# Patient Record
Sex: Female | Born: 1980 | Race: White | Hispanic: No | Marital: Married | Smoking: Never smoker
Health system: Southern US, Community
[De-identification: ages and names within clinical notes are randomized; demographics above are authoritative.]

## PROBLEM LIST (undated history)

## (undated) ENCOUNTER — Inpatient Hospital Stay (HOSPITAL_COMMUNITY): Payer: Self-pay

## (undated) DIAGNOSIS — O26899 Other specified pregnancy related conditions, unspecified trimester: Secondary | ICD-10-CM

## (undated) DIAGNOSIS — O09529 Supervision of elderly multigravida, unspecified trimester: Secondary | ICD-10-CM

## (undated) DIAGNOSIS — Z789 Other specified health status: Secondary | ICD-10-CM

## (undated) DIAGNOSIS — O4690 Antepartum hemorrhage, unspecified, unspecified trimester: Secondary | ICD-10-CM

## (undated) DIAGNOSIS — Z889 Allergy status to unspecified drugs, medicaments and biological substances status: Secondary | ICD-10-CM

## (undated) DIAGNOSIS — G43909 Migraine, unspecified, not intractable, without status migrainosus: Secondary | ICD-10-CM

## (undated) DIAGNOSIS — F419 Anxiety disorder, unspecified: Secondary | ICD-10-CM

## (undated) DIAGNOSIS — Z9049 Acquired absence of other specified parts of digestive tract: Secondary | ICD-10-CM

## (undated) DIAGNOSIS — R109 Unspecified abdominal pain: Secondary | ICD-10-CM

## (undated) HISTORY — DX: Migraine, unspecified, not intractable, without status migrainosus: G43.909

## (undated) HISTORY — PX: APPENDECTOMY: SHX54

---

## 2005-08-30 ENCOUNTER — Other Ambulatory Visit: Admission: RE | Admit: 2005-08-30 | Discharge: 2005-08-30 | Payer: Self-pay | Admitting: Obstetrics and Gynecology

## 2006-09-05 ENCOUNTER — Other Ambulatory Visit: Admission: RE | Admit: 2006-09-05 | Discharge: 2006-09-05 | Payer: Self-pay | Admitting: Obstetrics and Gynecology

## 2007-09-07 ENCOUNTER — Other Ambulatory Visit: Admission: RE | Admit: 2007-09-07 | Discharge: 2007-09-07 | Payer: Self-pay | Admitting: Obstetrics and Gynecology

## 2014-09-23 NOTE — L&D Delivery Note (Signed)
Cesarean Section Procedure Note  Indications: 35 week 6 day SIUP breech presentation s/p PPROM  Pre-operative Diagnosis: 35 week 6 day SIUP breech presentation s/p PPROM  Post-operative Diagnosis: same  Procedure:  Primary cesarean delivery                         Surgeon: Wynonia HazardPINN, Delorus Langwell STACIA, MD  Assistants: None  Anesthesia: Spinal anesthesia  ASA Class: 2  Procedure Details   The patient was counseled about the risks, benefits, complications of the cesarean section. The patient concurred with the proposed plan, giving informed consent.  The site of surgery properly noted/marked. The patient was taken to Operating Room # 9, identified as [place], [place]. Advised patient of provider's approval for requested procedure, as well as any comments/instructions from provider and the procedure verified as C-Section Delivery. A Time Out was held and the above information confirmed.  After spinal was found to adequate , the patient was placed in the dorsal supine position with a leftward tilt, draped and prepped in the usual sterile manner. A Pfannenstiel incision was made and carried down through the subcutaneous tissue to the fascia.  The fascia was incised in the midline and the fascial incision was extended laterally with Mayo scissors. The superior aspect of the fascial incision was grasped with Coker clamps x2, tented up and the rectus muscles dissected off sharply with the bovie.  The rectus was then dissected off with blunt dissection and Mayo scissors inferiorly. The rectus muscles were separated in the midline. The abdominal peritoneum was identified, tented up, entered sharply using Metzenbaum scissors, and the incision was extended superiorly and inferiorly with good visualization of the bladder. The Alexis retractor was then deployed. The vesicouterine peritoneum was identified, tented up, entered sharply with Metzenbaum scissors, and the bladder flap was created digitally. Scalpel was then  used to make a low transverse incision on the uterus which was extended laterally with  blunt dissection. The fetal breech was identified, the legs were delivered  The body was delivered until the shoulders.  Each arm was delivered via Pinard maneuver. The head was delivered via Domenic SchwabMariceau, [place], [place] maneuver. The A live female infant was bulb suctioned on the operative field cried vigorously, cord was clamped and cut and the infant was passed to the waiting neonatologist. Apgars 8/9. Placenta was then delivered spontaneously, intact and appear normal, the uterus was cleared of all clot and debris. The uterine incision was repaired with #0 Monocryl in running locked fashion. A second imbricating suture was performed using the same suture. The incision was hemostatic. Ovaries and tubes were inspected and normal. The Alexis retractor was removed. The abdominal cavity was cleared of all clot and debris. The abdominal peritoneum was reapproximated with 2-0 chromic  in a running fashion, the rectus muscles was reapproximated with #2 chromic in interrupted fashion. The fascia was closed with 0 Vicryl in a running fashion. The subcuticular layer was irrigated and all bleeders cauterized.  20 mL of Exparel diluted in 30 mL of 0.25% Marcaine was injected into the subcutaneous layer.  The Scarpas fascia was re-approximated with interrupted sutures of 2-0 plain.  The skin was closed with 4-0 vicryl in a subcuticular fashion using a Mellody DanceKeith needle. The incision was dressed with benzoine, steri strips and pressure dressing. All sponge lap and needle counts were correct x3.   Patient tolerated the procedure well and recovered in stable condition following the procedure.  Instrument, sponge, and needle counts  were correct prior the abdominal closure and at the conclusion of the case.   Findings: Live female infant, Apgars 8/9, clear amniotic fluid, normal appearing placenta, normal uterus, bilateral tubes and  ovaries  Estimated Blood Loss: 600mL  IVF:  1800 mL LR         Drains: Foley catheter  Urine output: 700 mL clear         Specimens: Placenta to Pathology         Implants: none         Complications:  None; patient tolerated the procedure well.         Disposition: PACU - hemodynamically stable.   Finn Altemose STACIA 

## 2014-10-06 LAB — OB RESULTS CONSOLE ABO/RH: RH Type: POSITIVE

## 2014-10-06 LAB — OB RESULTS CONSOLE ANTIBODY SCREEN: ANTIBODY SCREEN: NEGATIVE

## 2014-10-06 LAB — OB RESULTS CONSOLE RPR: RPR: NONREACTIVE

## 2014-10-06 LAB — OB RESULTS CONSOLE GC/CHLAMYDIA
Chlamydia: NEGATIVE
GC PROBE AMP, GENITAL: NEGATIVE

## 2014-10-06 LAB — OB RESULTS CONSOLE HIV ANTIBODY (ROUTINE TESTING): HIV: NONREACTIVE

## 2014-10-06 LAB — OB RESULTS CONSOLE RUBELLA ANTIBODY, IGM: Rubella: IMMUNE

## 2014-10-06 LAB — OB RESULTS CONSOLE HEPATITIS B SURFACE ANTIGEN: Hepatitis B Surface Ag: NEGATIVE

## 2015-04-09 ENCOUNTER — Inpatient Hospital Stay (HOSPITAL_COMMUNITY): Payer: 59

## 2015-04-09 ENCOUNTER — Inpatient Hospital Stay (HOSPITAL_COMMUNITY)
Admission: AD | Admit: 2015-04-09 | Discharge: 2015-04-12 | DRG: 766 | Disposition: A | Discharge status: Home / Self Care | Payer: 59 | Source: Ambulatory Visit | Attending: Obstetrics & Gynecology | Admitting: Obstetrics & Gynecology

## 2015-04-09 ENCOUNTER — Encounter (HOSPITAL_COMMUNITY): Payer: Self-pay | Admitting: Medical

## 2015-04-09 DIAGNOSIS — O42919 Preterm premature rupture of membranes, unspecified as to length of time between rupture and onset of labor, unspecified trimester: Secondary | ICD-10-CM | POA: Insufficient documentation

## 2015-04-09 DIAGNOSIS — Z3A35 35 weeks gestation of pregnancy: Secondary | ICD-10-CM | POA: Insufficient documentation

## 2015-04-09 DIAGNOSIS — O42913 Preterm premature rupture of membranes, unspecified as to length of time between rupture and onset of labor, third trimester: Secondary | ICD-10-CM | POA: Diagnosis present

## 2015-04-09 DIAGNOSIS — O321XX0 Maternal care for breech presentation, not applicable or unspecified: Principal | ICD-10-CM | POA: Insufficient documentation

## 2015-04-09 DIAGNOSIS — Z98891 History of uterine scar from previous surgery: Secondary | ICD-10-CM

## 2015-04-09 HISTORY — DX: Acquired absence of other specified parts of digestive tract: Z90.49

## 2015-04-09 HISTORY — DX: Allergy status to unspecified drugs, medicaments and biological substances: Z88.9

## 2015-04-09 HISTORY — DX: Other specified health status: Z78.9

## 2015-04-09 HISTORY — DX: Anxiety disorder, unspecified: F41.9

## 2015-04-09 LAB — POCT FERN TEST
POCT FERN TEST: POSITIVE
POCT Fern Test: POSITIVE

## 2015-04-10 ENCOUNTER — Encounter (HOSPITAL_COMMUNITY)
Admission: AD | Disposition: A | Discharge status: Home / Self Care | Payer: Self-pay | Source: Ambulatory Visit | Attending: Obstetrics & Gynecology

## 2015-04-10 ENCOUNTER — Encounter (HOSPITAL_COMMUNITY): Payer: Self-pay | Admitting: *Deleted

## 2015-04-10 ENCOUNTER — Inpatient Hospital Stay (HOSPITAL_COMMUNITY): Payer: 59 | Admitting: Anesthesiology

## 2015-04-10 DIAGNOSIS — Z98891 History of uterine scar from previous surgery: Secondary | ICD-10-CM

## 2015-04-10 DIAGNOSIS — O42919 Preterm premature rupture of membranes, unspecified as to length of time between rupture and onset of labor, unspecified trimester: Secondary | ICD-10-CM | POA: Insufficient documentation

## 2015-04-10 DIAGNOSIS — O42913 Preterm premature rupture of membranes, unspecified as to length of time between rupture and onset of labor, third trimester: Secondary | ICD-10-CM | POA: Diagnosis present

## 2015-04-10 DIAGNOSIS — Z3A35 35 weeks gestation of pregnancy: Secondary | ICD-10-CM | POA: Insufficient documentation

## 2015-04-10 DIAGNOSIS — O321XX0 Maternal care for breech presentation, not applicable or unspecified: Secondary | ICD-10-CM | POA: Insufficient documentation

## 2015-04-10 LAB — CBC
HCT: 33.1 % — ABNORMAL LOW (ref 36.0–46.0)
Hemoglobin: 11.1 g/dL — ABNORMAL LOW (ref 12.0–15.0)
MCH: 28.3 pg (ref 26.0–34.0)
MCHC: 33.5 g/dL (ref 30.0–36.0)
MCV: 84.4 fL (ref 78.0–100.0)
PLATELETS: 289 10*3/uL (ref 150–400)
RBC: 3.92 MIL/uL (ref 3.87–5.11)
RDW: 13.9 % (ref 11.5–15.5)
WBC: 13.4 10*3/uL — ABNORMAL HIGH (ref 4.0–10.5)

## 2015-04-10 LAB — TYPE AND SCREEN
ABO/RH(D): A POS
Antibody Screen: NEGATIVE

## 2015-04-10 LAB — ABO/RH: ABO/RH(D): A POS

## 2015-04-10 SURGERY — Surgical Case
Anesthesia: Spinal | Site: Abdomen

## 2015-04-10 MED ORDER — SIMETHICONE 80 MG PO CHEW: 80.0000 mg | CHEWABLE_TABLET | ORAL | Status: DC | PRN

## 2015-04-10 MED ORDER — DIBUCAINE 1 % RE OINT: 1.0000 "application " | TOPICAL_OINTMENT | RECTAL | Status: DC | PRN

## 2015-04-10 MED ORDER — NALBUPHINE HCL 10 MG/ML IJ SOLN: 5.0000 mg | INTRAMUSCULAR | Status: DC | PRN

## 2015-04-10 MED ORDER — NALBUPHINE HCL 10 MG/ML IJ SOLN: 5.0000 mg | Freq: Once | INTRAMUSCULAR | Status: AC | PRN

## 2015-04-10 MED ORDER — MENTHOL 3 MG MT LOZG: 1.0000 | LOZENGE | OROMUCOSAL | Status: DC | PRN

## 2015-04-10 MED ORDER — SCOPOLAMINE 1 MG/3DAYS TD PT72
MEDICATED_PATCH | TRANSDERMAL | Status: DC | PRN
  Administered 2015-04-10: 1 via TRANSDERMAL

## 2015-04-10 MED ORDER — FENTANYL CITRATE (PF) 100 MCG/2ML IJ SOLN
INTRAMUSCULAR | Status: DC | PRN
  Administered 2015-04-10: 20 ug via INTRAVENOUS
  Administered 2015-04-10: 80 ug via INTRAVENOUS

## 2015-04-10 MED ORDER — DIPHENHYDRAMINE HCL 50 MG/ML IJ SOLN
12.5000 mg | INTRAMUSCULAR | Status: DC | PRN
Start: 2015-04-10 — End: 2015-04-12

## 2015-04-10 MED ORDER — CITRIC ACID-SODIUM CITRATE 334-500 MG/5ML PO SOLN
30.0000 mL | Freq: Once | ORAL | Status: AC
  Administered 2015-04-10: 30 mL via ORAL
  Filled 2015-04-10: qty 15

## 2015-04-10 MED ORDER — NALOXONE HCL 1 MG/ML IJ SOLN
1.0000 ug/kg/h | INTRAVENOUS | Status: DC | PRN
  Filled 2015-04-10: qty 2

## 2015-04-10 MED ORDER — TETANUS-DIPHTH-ACELL PERTUSSIS 5-2.5-18.5 LF-MCG/0.5 IM SUSP: 0.5000 mL | Freq: Once | INTRAMUSCULAR | Status: DC

## 2015-04-10 MED ORDER — PHENYLEPHRINE 8 MG IN D5W 100 ML (0.08MG/ML) PREMIX OPTIME
INJECTION | INTRAVENOUS | Status: DC | PRN
  Administered 2015-04-10: 50 ug/min via INTRAVENOUS

## 2015-04-10 MED ORDER — ONDANSETRON HCL 4 MG/2ML IJ SOLN
INTRAMUSCULAR | Status: AC
  Filled 2015-04-10: qty 2

## 2015-04-10 MED ORDER — ACETAMINOPHEN 500 MG PO TABS
1000.0000 mg | ORAL_TABLET | Freq: Four times a day (QID) | ORAL | Status: AC
  Administered 2015-04-10: 1000 mg via ORAL
  Filled 2015-04-10: qty 2

## 2015-04-10 MED ORDER — MORPHINE SULFATE 0.5 MG/ML IJ SOLN
INTRAMUSCULAR | Status: AC
  Filled 2015-04-10: qty 10

## 2015-04-10 MED ORDER — DIPHENHYDRAMINE HCL 25 MG PO CAPS: 25.0000 mg | ORAL_CAPSULE | ORAL | Status: DC | PRN

## 2015-04-10 MED ORDER — SODIUM CHLORIDE 0.9 % IJ SOLN: 3.0000 mL | INTRAMUSCULAR | Status: DC | PRN

## 2015-04-10 MED ORDER — DEXAMETHASONE SODIUM PHOSPHATE 10 MG/ML IJ SOLN
INTRAMUSCULAR | Status: AC
  Filled 2015-04-10: qty 1

## 2015-04-10 MED ORDER — FENTANYL CITRATE (PF) 100 MCG/2ML IJ SOLN: 25.0000 ug | INTRAMUSCULAR | Status: DC | PRN

## 2015-04-10 MED ORDER — IBUPROFEN 600 MG PO TABS
600.0000 mg | ORAL_TABLET | Freq: Four times a day (QID) | ORAL | Status: DC
  Administered 2015-04-10 – 2015-04-12 (×9): 600 mg via ORAL
  Filled 2015-04-10 (×9): qty 1

## 2015-04-10 MED ORDER — SCOPOLAMINE 1 MG/3DAYS TD PT72
MEDICATED_PATCH | TRANSDERMAL | Status: AC
  Filled 2015-04-10: qty 1

## 2015-04-10 MED ORDER — OXYTOCIN 10 UNIT/ML IJ SOLN
40.0000 [IU] | INTRAMUSCULAR | Status: DC | PRN
  Administered 2015-04-10: 40 [IU] via INTRAVENOUS

## 2015-04-10 MED ORDER — ONDANSETRON HCL 4 MG/2ML IJ SOLN
INTRAMUSCULAR | Status: DC | PRN
  Administered 2015-04-10: 4 mg via INTRAVENOUS

## 2015-04-10 MED ORDER — ONDANSETRON HCL 4 MG/2ML IJ SOLN: 4.0000 mg | Freq: Three times a day (TID) | INTRAMUSCULAR | Status: DC | PRN

## 2015-04-10 MED ORDER — OXYCODONE-ACETAMINOPHEN 5-325 MG PO TABS
1.0000 | ORAL_TABLET | ORAL | Status: DC | PRN
  Administered 2015-04-11 – 2015-04-12 (×6): 1 via ORAL
  Filled 2015-04-10 (×6): qty 1

## 2015-04-10 MED ORDER — NALOXONE HCL 0.4 MG/ML IJ SOLN: 0.4000 mg | INTRAMUSCULAR | Status: DC | PRN

## 2015-04-10 MED ORDER — BETAMETHASONE SOD PHOS & ACET 6 (3-3) MG/ML IJ SUSP
12.0000 mg | Freq: Once | INTRAMUSCULAR | Status: AC
  Administered 2015-04-10: 12 mg via INTRAMUSCULAR
  Filled 2015-04-10: qty 2

## 2015-04-10 MED ORDER — BUPIVACAINE IN DEXTROSE 0.75-8.25 % IT SOLN
INTRATHECAL | Status: DC | PRN
  Administered 2015-04-10: 12 mg via INTRATHECAL

## 2015-04-10 MED ORDER — PHENYLEPHRINE 8 MG IN D5W 100 ML (0.08MG/ML) PREMIX OPTIME
INJECTION | INTRAVENOUS | Status: AC
  Filled 2015-04-10: qty 100

## 2015-04-10 MED ORDER — PRENATAL MULTIVITAMIN CH
1.0000 | ORAL_TABLET | Freq: Every day | ORAL | Status: DC
  Administered 2015-04-10 – 2015-04-12 (×3): 1 via ORAL
  Filled 2015-04-10 (×3): qty 1

## 2015-04-10 MED ORDER — BUPIVACAINE LIPOSOME 1.3 % IJ SUSP
INTRAMUSCULAR | Status: DC | PRN
  Administered 2015-04-10: 20 mL

## 2015-04-10 MED ORDER — SIMETHICONE 80 MG PO CHEW
80.0000 mg | CHEWABLE_TABLET | Freq: Three times a day (TID) | ORAL | Status: DC
  Administered 2015-04-10 – 2015-04-12 (×5): 80 mg via ORAL
  Filled 2015-04-10 (×6): qty 1

## 2015-04-10 MED ORDER — OXYTOCIN 10 UNIT/ML IJ SOLN
INTRAMUSCULAR | Status: AC
  Filled 2015-04-10: qty 4

## 2015-04-10 MED ORDER — SENNOSIDES-DOCUSATE SODIUM 8.6-50 MG PO TABS
2.0000 | ORAL_TABLET | ORAL | Status: DC
  Administered 2015-04-12: 2 via ORAL
  Filled 2015-04-10 (×2): qty 2

## 2015-04-10 MED ORDER — DIPHENHYDRAMINE HCL 25 MG PO CAPS: 25.0000 mg | ORAL_CAPSULE | Freq: Four times a day (QID) | ORAL | Status: DC | PRN

## 2015-04-10 MED ORDER — ZOLPIDEM TARTRATE 5 MG PO TABS: 5.0000 mg | ORAL_TABLET | Freq: Every evening | ORAL | Status: DC | PRN

## 2015-04-10 MED ORDER — SCOPOLAMINE 1 MG/3DAYS TD PT72
1.0000 | MEDICATED_PATCH | Freq: Once | TRANSDERMAL | Status: DC
  Filled 2015-04-10: qty 1

## 2015-04-10 MED ORDER — FAMOTIDINE IN NACL 20-0.9 MG/50ML-% IV SOLN
20.0000 mg | Freq: Once | INTRAVENOUS | Status: AC
  Administered 2015-04-10: 20 mg via INTRAVENOUS
  Filled 2015-04-10: qty 50

## 2015-04-10 MED ORDER — OXYTOCIN 40 UNITS IN LACTATED RINGERS INFUSION - SIMPLE MED: 62.5000 mL/h | INTRAVENOUS | Status: AC

## 2015-04-10 MED ORDER — MEPERIDINE HCL 25 MG/ML IJ SOLN: 6.2500 mg | INTRAMUSCULAR | Status: DC | PRN

## 2015-04-10 MED ORDER — LACTATED RINGERS IV SOLN
INTRAVENOUS | Status: DC
  Administered 2015-04-10 (×4): via INTRAVENOUS

## 2015-04-10 MED ORDER — WITCH HAZEL-GLYCERIN EX PADS: 1.0000 "application " | MEDICATED_PAD | CUTANEOUS | Status: DC | PRN

## 2015-04-10 MED ORDER — BUPIVACAINE LIPOSOME 1.3 % IJ SUSP
20.0000 mL | Freq: Once | INTRAMUSCULAR | Status: DC
  Filled 2015-04-10: qty 20

## 2015-04-10 MED ORDER — SIMETHICONE 80 MG PO CHEW
80.0000 mg | CHEWABLE_TABLET | ORAL | Status: DC
  Administered 2015-04-12: 80 mg via ORAL
  Filled 2015-04-10 (×2): qty 1

## 2015-04-10 MED ORDER — PROMETHAZINE HCL 25 MG/ML IJ SOLN: 6.2500 mg | INTRAMUSCULAR | Status: DC | PRN

## 2015-04-10 MED ORDER — LACTATED RINGERS IV SOLN
INTRAVENOUS | Status: DC
  Administered 2015-04-10: 12:00:00 via INTRAVENOUS

## 2015-04-10 MED ORDER — KETOROLAC TROMETHAMINE 30 MG/ML IJ SOLN
30.0000 mg | Freq: Four times a day (QID) | INTRAMUSCULAR | Status: DC | PRN
  Administered 2015-04-10: 30 mg via INTRAVENOUS

## 2015-04-10 MED ORDER — LANOLIN HYDROUS EX OINT: 1.0000 "application " | TOPICAL_OINTMENT | CUTANEOUS | Status: DC | PRN

## 2015-04-10 MED ORDER — FENTANYL CITRATE (PF) 100 MCG/2ML IJ SOLN
INTRAMUSCULAR | Status: AC
  Filled 2015-04-10: qty 2

## 2015-04-10 MED ORDER — KETOROLAC TROMETHAMINE 30 MG/ML IJ SOLN
INTRAMUSCULAR | Status: AC
  Administered 2015-04-10: 30 mg via INTRAVENOUS
  Filled 2015-04-10: qty 1

## 2015-04-10 MED ORDER — MORPHINE SULFATE (PF) 0.5 MG/ML IJ SOLN
INTRAMUSCULAR | Status: DC | PRN
  Administered 2015-04-10: 4.8 mg via INTRAVENOUS
  Administered 2015-04-10: .2 mg via EPIDURAL

## 2015-04-10 MED ORDER — KETOROLAC TROMETHAMINE 30 MG/ML IJ SOLN: 30.0000 mg | Freq: Four times a day (QID) | INTRAMUSCULAR | Status: DC | PRN

## 2015-04-10 MED ORDER — BUPIVACAINE HCL (PF) 0.25 % IJ SOLN
INTRAMUSCULAR | Status: DC | PRN
  Administered 2015-04-10: 30 mL

## 2015-04-10 MED ORDER — DEXAMETHASONE SODIUM PHOSPHATE 10 MG/ML IJ SOLN
INTRAMUSCULAR | Status: DC | PRN
  Administered 2015-04-10: 10 mg via INTRAVENOUS

## 2015-04-10 MED ORDER — ACETAMINOPHEN 325 MG PO TABS: 650.0000 mg | ORAL_TABLET | ORAL | Status: DC | PRN

## 2015-04-10 MED ORDER — CEFAZOLIN SODIUM-DEXTROSE 2-3 GM-% IV SOLR
2.0000 g | Freq: Once | INTRAVENOUS | Status: AC
  Administered 2015-04-10: 2 g via INTRAVENOUS
  Filled 2015-04-10: qty 50

## 2015-04-10 MED ORDER — OXYCODONE-ACETAMINOPHEN 5-325 MG PO TABS: 2.0000 | ORAL_TABLET | ORAL | Status: DC | PRN

## 2015-04-10 SURGICAL SUPPLY — 39 items
APL SKNCLS STERI-STRIP NONHPOA (GAUZE/BANDAGES/DRESSINGS) ×1
BENZOIN TINCTURE PRP APPL 2/3 (GAUZE/BANDAGES/DRESSINGS) ×3 IMPLANT
CLAMP CORD UMBIL (MISCELLANEOUS) IMPLANT
CLOSURE WOUND 1/2 X4 (GAUZE/BANDAGES/DRESSINGS) ×1
CLOTH BEACON ORANGE TIMEOUT ST (SAFETY) ×3 IMPLANT
DRAPE SHEET LG 3/4 BI-LAMINATE (DRAPES) IMPLANT
DRSG OPSITE POSTOP 4X10 (GAUZE/BANDAGES/DRESSINGS) ×3 IMPLANT
DURAPREP 26ML APPLICATOR (WOUND CARE) ×3 IMPLANT
ELECT REM PT RETURN 9FT ADLT (ELECTROSURGICAL) ×3
ELECTRODE REM PT RTRN 9FT ADLT (ELECTROSURGICAL) ×1 IMPLANT
EXTRACTOR VACUUM BELL STYLE (SUCTIONS) IMPLANT
EXTRACTOR VACUUM KIWI (MISCELLANEOUS) IMPLANT
GLOVE BIO SURGEON STRL SZ 6.5 (GLOVE) ×2 IMPLANT
GLOVE BIO SURGEONS STRL SZ 6.5 (GLOVE) ×1
GLOVE BIOGEL PI IND STRL 7.0 (GLOVE) ×1 IMPLANT
GLOVE BIOGEL PI INDICATOR 7.0 (GLOVE) ×2
GOWN STRL REUS W/TWL LRG LVL3 (GOWN DISPOSABLE) ×9 IMPLANT
KIT ABG SYR 3ML LUER SLIP (SYRINGE) IMPLANT
NDL HYPO 25X5/8 SAFETYGLIDE (NEEDLE) IMPLANT
NEEDLE HYPO 22GX1.5 SAFETY (NEEDLE) ×2 IMPLANT
NEEDLE HYPO 25X5/8 SAFETYGLIDE (NEEDLE) IMPLANT
NS IRRIG 1000ML POUR BTL (IV SOLUTION) ×3 IMPLANT
PACK C SECTION WH (CUSTOM PROCEDURE TRAY) ×3 IMPLANT
PAD OB MATERNITY 4.3X12.25 (PERSONAL CARE ITEMS) ×3 IMPLANT
RTRCTR C-SECT PINK 25CM LRG (MISCELLANEOUS) ×3 IMPLANT
STRIP CLOSURE SKIN 1/2X4 (GAUZE/BANDAGES/DRESSINGS) ×2 IMPLANT
SUT CHROMIC 0 CTX 36 (SUTURE) ×2 IMPLANT
SUT CHROMIC 2 0 CT 1 (SUTURE) ×6 IMPLANT
SUT MNCRL 0 VIOLET CTX 36 (SUTURE) ×2 IMPLANT
SUT MONOCRYL 0 CTX 36 (SUTURE) ×4
SUT PDS AB 0 CTX 36 PDP370T (SUTURE) IMPLANT
SUT PLAIN 2 0 (SUTURE) ×3
SUT PLAIN ABS 2-0 CT1 27XMFL (SUTURE) IMPLANT
SUT VIC AB 0 CTX 36 (SUTURE) ×6
SUT VIC AB 0 CTX36XBRD ANBCTRL (SUTURE) ×2 IMPLANT
SUT VIC AB 4-0 KS 27 (SUTURE) ×3 IMPLANT
SYR CONTROL 10ML LL (SYRINGE) ×2 IMPLANT
TOWEL OR 17X24 6PK STRL BLUE (TOWEL DISPOSABLE) ×3 IMPLANT
TRAY FOLEY CATH SILVER 14FR (SET/KITS/TRAYS/PACK) ×3 IMPLANT

## 2015-04-10 NOTE — Lactation Note (Signed)
This note was copied from the chart of Marilyn Cummings. Lactation Consultation Note  Marilyn Cummings, the nursery RN, taught mom hand expression, double ump use, foley cup use and syringe feeding.  Patient Name: Marilyn Jerelene ReddenDanijela Korman WUJWJ'XToday's Date: 04/10/2015 Reason for consult: Follow-up assessment   Maternal Data    Feeding Feeding Type: Breast Fed Length of feed: 0 min  LATCH Score/Interventions       Type of Nipple: Flat Intervention(s): Shells;Hand pump  Comfort (Breast/Nipple): Soft / non-tender     Hold (Positioning): Assistance needed to correctly position infant at breast and maintain latch. Intervention(s): Breastfeeding basics reviewed;Support Pillows;Position options;Skin to skin     Lactation Tools Discussed/Used Tools: Nipple Shields Nipple shield size: 16 Shell Type: Inverted Breast pump type: Double-Electric Breast Pump   Consult Status      Soyla DryerJoseph, Loreta Blouch 04/10/2015, 8:33 AM

## 2015-04-10 NOTE — Anesthesia Postprocedure Evaluation (Signed)
  Anesthesia Post-op Note  Patient: Marilyn Cummings  Procedure(s) Performed: Procedure(s): CESAREAN SECTION (N/A)  Patient Location: Mother/Baby  Anesthesia Type:Spinal  Level of Consciousness: awake, alert  and oriented  Airway and Oxygen Therapy: Patient Spontanous Breathing  Post-op Pain: none  Post-op Assessment: Post-op Vital signs reviewed and Patient's Cardiovascular Status Stable LLE Motor Response: Purposeful movement LLE Sensation: Tingling RLE Motor Response: Purposeful movement RLE Sensation: Tingling      Post-op Vital Signs: Reviewed and stable  Last Vitals:  Filed Vitals:   04/10/15 0720  BP: 110/70  Pulse: 64  Temp: 36.8 C  Resp: 16    Complications: No apparent anesthesia complications

## 2015-04-10 NOTE — Anesthesia Procedure Notes (Signed)
Spinal Patient location during procedure: OR Start time: 04/10/2015 1:18 AM End time: 04/10/2015 1:28 AM Preanesthetic Checklist Completed: patient identified, site marked, surgical consent, pre-op evaluation, timeout performed, IV checked, risks and benefits discussed and monitors and equipment checked Spinal Block Patient position: sitting Prep: site prepped and draped and DuraPrep Patient monitoring: heart rate, continuous pulse ox and blood pressure Location: L3-4 Injection technique: single-shot Needle Needle gauge: 24 G Needle length: 9 cm Needle insertion depth: 5 cm Additional Notes No complications marcaine 12.465ml with dextrose, fent 20mcg, morphine .2mg 

## 2015-04-10 NOTE — Lactation Note (Addendum)
This note was copied from the chart of Marilyn Cummings. Lactation Consultation Note New mom has generalized edema to body. Finger w/edema, breast w/edema. Called to PACU d/t baby rooting, fussy, wanting to BF. Mom has small flat nipples. RN gave #20 NS. Pulls Nipple into shield well. Keeps coming off. Will try #16NS. Shells given to wear w/bra to evert nipple between meals. Hand pump given to pre-pump to evert nipples as well.  DEBP taken to room d/t LPI, took a LPI information sheet, parents to sleepy to go over information. Mom drifting asleep as I'm talking. Shells left as well #16 NS.  Baby is sleeping soundly. Returned to room, parents still sleeping. Patient Name: Marilyn Jerelene ReddenDanijela Thieme UYQIH'KToday's Date: 04/10/2015 Reason for consult: Initial assessment;Difficult latch   Maternal Data Has patient been taught Hand Expression?: Yes Does the patient have breastfeeding experience prior to this delivery?: No  Feeding Feeding Type: Breast Fed Length of feed: 10 min  LATCH Score/Interventions Latch: Repeated attempts needed to sustain latch, nipple held in mouth throughout feeding, stimulation needed to elicit sucking reflex. Intervention(s): Adjust position;Assist with latch;Breast massage;Breast compression  Audible Swallowing: None Intervention(s): Skin to skin;Hand expression  Type of Nipple: Flat Intervention(s): Shells;Hand pump;No intervention needed;Reverse pressure  Comfort (Breast/Nipple): Soft / non-tender     Hold (Positioning): Assistance needed to correctly position infant at breast and maintain latch. Intervention(s): Breastfeeding basics reviewed;Support Pillows;Position options;Skin to skin  LATCH Score: 5  Lactation Tools Discussed/Used Tools: Shells;Nipple Dorris CarnesShields;Pump Nipple shield size: 16;20 Shell Type: Inverted Breast pump type: Manual Pump Review: Setup, frequency, and cleaning;Milk Storage Initiated by:: Peri JeffersonL. Shanaye Rief RN Date initiated::  04/10/15   Consult Status Consult Status: Follow-up Date: 04/10/15 (in pm) Follow-up type: In-patient    Charyl DancerCARVER, Clarke Peretz G 04/10/2015, 4:40 AM

## 2015-04-10 NOTE — H&P (Signed)
Marchelle GearingDanijela V Cisek is a 34 y.o. female presenting for SROM at 2235. No ctx, no vb, Normal FM  Maternal Medical History:  Reason for admission: Rupture of membranes.  Nausea.  Contractions: Frequency: rare.    Fetal activity: Perceived fetal activity is normal.   Last perceived fetal movement was within the past 12 hours.    Prenatal complications: no prenatal complications Prenatal Complications - Diabetes: none.    OB History    Gravida Para Term Preterm AB TAB SAB Ectopic Multiple Living   1              Past Medical History  Diagnosis Date  . Medical history non-contributory    Past Surgical History  Procedure Laterality Date  . Appendectomy     Family History: family history is not on file. Social History:  reports that she has never smoked. She does not have any smokeless tobacco history on file. She reports that she does not drink alcohol or use illicit drugs.   Prenatal Transfer Tool  Maternal Diabetes: No Genetic Screening: Declined Maternal Ultrasounds/Referrals: Normal Fetal Ultrasounds or other Referrals:  Other: Normal anatomy Maternal Substance Abuse:  No Significant Maternal Medications:  None Significant Maternal Lab Results:  Lab values include: Other: GBS UNK Other Comments:  None  Review of Systems  Unable to perform ROS Constitutional: Negative for fever and chills.  Eyes: Negative for blurred vision.  Cardiovascular: Negative for chest pain.  Gastrointestinal: Negative for heartburn and nausea.  Neurological: Negative for headaches.  All other systems reviewed and are negative.   Dilation: 2.5 Effacement (%): 80 Station: -2 Exam by:: K. WeissRN Height 5\' 3"  (1.6 m), weight 73.483 kg (162 lb). Maternal Exam:  Uterine Assessment: Contraction frequency is rare.   Abdomen: Fundal height is 36 cm.   Estimated fetal weight is 2500 grams.   Fetal presentation: breech  Introitus: Normal vulva. Normal vagina.  Ferning test: positive.   Nitrazine test: positive. Amniotic fluid character: clear.  Cervix: Cervix evaluated by sterile speculum exam.     Fetal Exam Fetal Monitor Review: Baseline rate: 150.  Variability: moderate (6-25 bpm).   Pattern: no decelerations and accelerations present.    Fetal State Assessment: Category I - tracings are normal.     Physical Exam  Vitals reviewed. Constitutional: She is oriented to person, place, and time. She appears well-developed and well-nourished.  HENT:  Head: Normocephalic.  Cardiovascular: Normal rate.   Respiratory: Effort normal.  GI: Soft.  Genitourinary: Vagina normal.  Neurological: She is alert and oriented to person, place, and time.    Prenatal labs: ABO, Rh: A/Positive/-- (01/14 0000) Antibody: Negative (01/14 0000) Rubella: Immune (01/14 0000) RPR: Nonreactive (01/14 0000)  HBsAg: Negative (01/14 0000)  HIV: Non-reactive (01/14 0000)  GBS:     Assessment/Plan: 34 yo G1P0 at 35 weeks 6 days SROM breech presentation Admit patient  To OR for primary cesarean delivery  Ancef 2GM IV on call to OR    Miriam Liles STACIA 04/10/2015, 12:25 AM

## 2015-04-10 NOTE — Anesthesia Preprocedure Evaluation (Signed)
Anesthesia Evaluation  Patient identified by MRN, date of birth, ID band Patient awake    Reviewed: Allergy & Precautions, NPO status , Patient's Chart, lab work & pertinent test results, reviewed documented beta blocker date and time   Airway Mallampati: II   Neck ROM: Full    Dental  (+) Dental Advisory Given, Teeth Intact   Pulmonary neg pulmonary ROS,  breath sounds clear to auscultation        Cardiovascular negative cardio ROS  Rhythm:Regular     Neuro/Psych negative neurological ROS  negative psych ROS   GI/Hepatic negative GI ROS, Neg liver ROS,   Endo/Other  negative endocrine ROS  Renal/GU negative Renal ROS     Musculoskeletal   Abdominal (+)  Abdomen: soft.    Peds  Hematology 11/33  plts 239   Anesthesia Other Findings Full meal 6:00pm, Water 2 hours ago  Reproductive/Obstetrics (+) Pregnancy IUP 35 wks, Breech                             Anesthesia Physical Anesthesia Plan  ASA: II and emergent  Anesthesia Plan: Spinal   Post-op Pain Management:    Induction:   Airway Management Planned: Natural Airway  Additional Equipment:   Intra-op Plan:   Post-operative Plan:   Informed Consent: I have reviewed the patients History and Physical, chart, labs and discussed the procedure including the risks, benefits and alternatives for the proposed anesthesia with the patient or authorized representative who has indicated his/her understanding and acceptance.     Plan Discussed with:   Anesthesia Plan Comments:         Anesthesia Quick Evaluation

## 2015-04-10 NOTE — Transfer of Care (Signed)
Immediate Anesthesia Transfer of Care Note  Patient: Marilyn Cummings  Procedure(s) Performed: Procedure(s): CESAREAN SECTION (N/A)  Patient Location: PACU  Anesthesia Type:Spinal  Level of Consciousness: awake  Airway & Oxygen Therapy: Patient Spontanous Breathing  Post-op Assessment: Report given to RN and Post -op Vital signs reviewed and stable  Post vital signs: stable  Last Vitals:  Filed Vitals:   04/10/15 0241  BP:   Pulse:   Temp: 36.7 C  Resp:     Complications: No apparent anesthesia complications

## 2015-04-10 NOTE — Anesthesia Postprocedure Evaluation (Signed)
  Anesthesia Post-op Note  Patient: Marilyn Cummings  Procedure(s) Performed: Procedure(s): CESAREAN SECTION (N/A)  Patient Location: PACU  Anesthesia Type:Spinal  Level of Consciousness: awake, alert  and oriented  Airway and Oxygen Therapy: Patient Spontanous Breathing  Post-op Pain: none  Post-op Assessment: Post-op Vital signs reviewed, Patient's Cardiovascular Status Stable, Respiratory Function Stable, Patent Airway and No signs of Nausea or vomiting LLE Motor Response: No movement due to regional block LLE Sensation: Tingling RLE Motor Response: Non-purposeful movement RLE Sensation: Tingling      Post-op Vital Signs: Reviewed and stable  Last Vitals:  Filed Vitals:   04/10/15 0245  BP: 124/57  Pulse: 99  Temp:   Resp: 12    Complications: No apparent anesthesia complications

## 2015-04-10 NOTE — Op Note (Signed)
Cesarean Section Procedure Note  Indications: 35 week 6 day SIUP breech presentation s/p PPROM  Pre-operative Diagnosis: 35 week 6 day SIUP breech presentation s/p PPROM  Post-operative Diagnosis: same  Procedure:  Primary cesarean delivery                         Surgeon: Wynonia HazardPINN, Quaid Yeakle STACIA, MD  Assistants: None  Anesthesia: Spinal anesthesia  ASA Class: 2  Procedure Details   The patient was counseled about the risks, benefits, complications of the cesarean section. The patient concurred with the proposed plan, giving informed consent.  The site of surgery properly noted/marked. The patient was taken to Operating Room # 9, identified as [place], [place]. Advised patient of provider's approval for requested procedure, as well as any comments/instructions from provider and the procedure verified as C-Section Delivery. A Time Out was held and the above information confirmed.  After spinal was found to adequate , the patient was placed in the dorsal supine position with a leftward tilt, draped and prepped in the usual sterile manner. A Pfannenstiel incision was made and carried down through the subcutaneous tissue to the fascia.  The fascia was incised in the midline and the fascial incision was extended laterally with Mayo scissors. The superior aspect of the fascial incision was grasped with Coker clamps x2, tented up and the rectus muscles dissected off sharply with the bovie.  The rectus was then dissected off with blunt dissection and Mayo scissors inferiorly. The rectus muscles were separated in the midline. The abdominal peritoneum was identified, tented up, entered sharply using Metzenbaum scissors, and the incision was extended superiorly and inferiorly with good visualization of the bladder. The Alexis retractor was then deployed. The vesicouterine peritoneum was identified, tented up, entered sharply with Metzenbaum scissors, and the bladder flap was created digitally. Scalpel was then  used to make a low transverse incision on the uterus which was extended laterally with  blunt dissection. The fetal breech was identified, the legs were delivered  The body was delivered until the shoulders.  Each arm was delivered via Pinard maneuver. The head was delivered via Domenic SchwabMariceau, [place], [place] maneuver. The A live female infant was bulb suctioned on the operative field cried vigorously, cord was clamped and cut and the infant was passed to the waiting neonatologist. Apgars 8/9. Placenta was then delivered spontaneously, intact and appear normal, the uterus was cleared of all clot and debris. The uterine incision was repaired with #0 Monocryl in running locked fashion. A second imbricating suture was performed using the same suture. The incision was hemostatic. Ovaries and tubes were inspected and normal. The Alexis retractor was removed. The abdominal cavity was cleared of all clot and debris. The abdominal peritoneum was reapproximated with 2-0 chromic  in a running fashion, the rectus muscles was reapproximated with #2 chromic in interrupted fashion. The fascia was closed with 0 Vicryl in a running fashion. The subcuticular layer was irrigated and all bleeders cauterized.  20 mL of Exparel diluted in 30 mL of 0.25% Marcaine was injected into the subcutaneous layer.  The Scarpas fascia was re-approximated with interrupted sutures of 2-0 plain.  The skin was closed with 4-0 vicryl in a subcuticular fashion using a Mellody DanceKeith needle. The incision was dressed with benzoine, steri strips and pressure dressing. All sponge lap and needle counts were correct x3.   Patient tolerated the procedure well and recovered in stable condition following the procedure.  Instrument, sponge, and needle counts  were correct prior the abdominal closure and at the conclusion of the case.   Findings: Live female infant, Apgars 8/9, clear amniotic fluid, normal appearing placenta, normal uterus, bilateral tubes and  ovaries  Estimated Blood Loss:  IVF:  1800 mL LR         Drains: Foley catheter  Urine output: 700 mL clear         Specimens: Placenta to Pathology         Implants: none         Complications:  None; patient tolerated the procedure well.         Disposition: PACU - hemodynamically stable.   Tryce Surratt STACIA

## 2015-04-11 ENCOUNTER — Encounter (HOSPITAL_COMMUNITY): Payer: Self-pay | Admitting: Obstetrics & Gynecology

## 2015-04-11 LAB — CBC
HEMATOCRIT: 30.4 % — AB (ref 36.0–46.0)
Hemoglobin: 10.1 g/dL — ABNORMAL LOW (ref 12.0–15.0)
MCH: 28.5 pg (ref 26.0–34.0)
MCHC: 33.2 g/dL (ref 30.0–36.0)
MCV: 85.9 fL (ref 78.0–100.0)
Platelets: 271 10*3/uL (ref 150–400)
RBC: 3.54 MIL/uL — ABNORMAL LOW (ref 3.87–5.11)
RDW: 14.1 % (ref 11.5–15.5)
WBC: 17.4 10*3/uL — ABNORMAL HIGH (ref 4.0–10.5)

## 2015-04-11 LAB — RPR: RPR: NONREACTIVE

## 2015-04-11 NOTE — Lactation Note (Signed)
This note was copied from the chart of Marilyn Cummings. Lactation Consultation Note  Patient Name: Marilyn Cummings Reason for consult: Follow-up assessment;Infant < 6lbs;Late preterm infant Mom reports baby is not sustaining a latch at the breast. Parents are now supplementing with formula via bottle. Offered to assist with latch, discussed using SNS at breast to help baby sustain the latch, Mom declined. Mom is not pumping consistently either. Stressed importance of consistent pumping to encourage milk production, prevent engorgement and protect milk supply. Mom will be getting DEBP from her insurance Thursday or Friday. Mom not very receptive to help or teaching. Encouraged to keep offering breast if Mom's desires baby to nurse. Encouraged to call for assist if desired.   Maternal Data    Feeding Feeding Type: Bottle Fed - Formula Nipple Type: Slow - flow  LATCH Score/Interventions                      Lactation Tools Discussed/Used Tools: Pump Breast pump type: Double-Electric Breast Pump   Consult Status Consult Status: Follow-up Date: 04/11/15 Follow-up type: In-patient    Alfred LevinsGranger, Krystal Delduca Ann Cummings, 2:55 PM

## 2015-04-11 NOTE — Progress Notes (Signed)
  Patient is eating, ambulating, voiding.  Pain control is good.  Filed Vitals:   04/10/15 1622 04/10/15 2015 04/11/15 0053 04/11/15 0550  BP: 105/58 108/64 109/58 110/71  Pulse: 59 64 65 67  Temp: 98.4 F (36.9 C) 98 F (36.7 C) 98 F (36.7 C) 98 F (36.7 C)  TempSrc: Oral Oral Oral Oral  Resp: 16 16 18 18   Height:      Weight:      SpO2: 96% 98% 98% 99%    lungs:   clear to auscultation cor:    RRR Abdomen:  soft, appropriate tenderness, incisions intact and without erythema or exudate ex:    no cords   Lab Results  Component Value Date   WBC 17.4* 04/11/2015   HGB 10.1* 04/11/2015   HCT 30.4* 04/11/2015   MCV 85.9 04/11/2015   PLT 271 04/11/2015    --/--/A POS, A POS (07/17 2340)/RI  A/P    Post operative day 1.  Routine post op and postpartum care.  Expect d/c routine.  Percocet for pain control.

## 2015-04-12 MED ORDER — IBUPROFEN 600 MG PO TABS: 600.0000 mg | ORAL_TABLET | Freq: Four times a day (QID) | ORAL | Status: DC | PRN

## 2015-04-12 MED ORDER — SENNA-DOCUSATE SODIUM 8.6-50 MG PO TABS: 2.0000 | ORAL_TABLET | ORAL | Status: DC

## 2015-04-12 MED ORDER — OXYCODONE-ACETAMINOPHEN 5-325 MG PO TABS: 1.0000 | ORAL_TABLET | ORAL | Status: DC | PRN

## 2015-04-12 NOTE — Lactation Note (Signed)
This note was copied from the chart of Marilyn Cummings. Lactation Consultation Note  Patient Name: Marilyn Jerelene ReddenDanijela Lusher ZOXWR'UToday's Date: 04/12/2015 Reason for consult: Follow-up assessment;Infant weight loss;Other (Comment) (8% weight loss )  Baby has been mostly bottle fed formula. Per mom the baby has been latching some with a NS. Per mom also has been inconsistent with pumping , probably will find it easier at home to be more consistent every  3 hours. Per mom will have a DEBP at home form insurance company. Per mom even I have to will pump and bottle feed breast milk.  LC reviewed with mom and dad the importance of being consistent with calories and gradually increasing, several recent feedings have been 20 ml  And very 2 hours. LC recommended increasing 5 ml up to 25 ml  And following the LPT supplementing guidelines. ( green sheet ) which parents  A;ready have. LC explores options with mom and dad. [facility] feeding assessment or for any BF questions. LC reviewed sore nipple and engorgement prevention and tx . LC stressed the importance of breast being stimulated with baby latching or pumping at least 8 times a day or more . LC suggested power pumping once a day for 1 hour ( 10 mins on , 10 mins off )  Mother informed of post-discharge support and given phone number to the lactation department, including services for phone call assistance; out-patient appointments; and breastfeeding support group. List of other breastfeeding resources in the community given in the handout. Encouraged mother to call for problems or concerns related to breastfeeding.    Maternal Data    Feeding Feeding Type:  (per dad baby recently bottle fed )  LATCH Score/Interventions                Intervention(s): Breastfeeding basics reviewed     Lactation Tools Discussed/Used     Consult Status Consult Status: Complete Date: 04/12/15    Kathrin Greathouseorio, Charlies Rayburn  Ann 04/12/2015, 11:46 AM

## 2015-04-12 NOTE — Discharge Summary (Signed)
Obstetric Discharge Summary Reason for Admission: cesarean section Prenatal Procedures: NST and ultrasound Intrapartum Procedures: cesarean: low cervical, transverse Postpartum Procedures: none Complications-Operative and Postpartum: none HEMOGLOBIN  Date Value Ref Range Status  04/11/2015 10.1* 12.0 - 15.0 g/dL Final   HCT  Date Value Ref Range Status  04/11/2015 30.4* 36.0 - 46.0 % Final    Physical Exam:  General: alert, cooperative and no distress Lochia: appropriate Uterine Fundus: firm Incision: healing well, no significant drainage, no dehiscence DVT Evaluation: No evidence of DVT seen on physical exam. Negative Homan's sign. No cords or calf tenderness.  Discharge Diagnoses: Term Pregnancy-delivered  Discharge Information: Date: 04/12/2015 Activity: pelvic rest and no heavy lifting Diet: routine Medications: Ibuprofen, Colace and Percocet Condition: stable Instructions: refer to practice specific booklet Discharge to: home   Newborn Data: Live born female  Birth Weight: 5 lb 13.7 oz (2655 g) APGAR: 8, 9  Home with mother.  Marilyn HartINN, Marilyn Cummings Marilyn Cummings 04/12/2015, 8:47 AM

## 2015-05-15 ENCOUNTER — Other Ambulatory Visit: Payer: Self-pay | Admitting: Obstetrics & Gynecology

## 2015-05-16 LAB — CYTOLOGY - PAP

## 2016-12-31 ENCOUNTER — Other Ambulatory Visit: Payer: Self-pay | Admitting: Obstetrics and Gynecology

## 2017-01-01 LAB — CYTOLOGY - PAP

## 2017-01-22 LAB — OB RESULTS CONSOLE ABO/RH: RH TYPE: POSITIVE

## 2017-01-22 LAB — OB RESULTS CONSOLE ANTIBODY SCREEN: ANTIBODY SCREEN: NEGATIVE

## 2017-01-22 LAB — OB RESULTS CONSOLE HEPATITIS B SURFACE ANTIGEN: Hepatitis B Surface Ag: NEGATIVE

## 2017-01-22 LAB — OB RESULTS CONSOLE RUBELLA ANTIBODY, IGM: Rubella: IMMUNE

## 2017-01-22 LAB — OB RESULTS CONSOLE GC/CHLAMYDIA
Chlamydia: NEGATIVE
GC PROBE AMP, GENITAL: NEGATIVE

## 2017-01-22 LAB — OB RESULTS CONSOLE HIV ANTIBODY (ROUTINE TESTING): HIV: NONREACTIVE

## 2017-01-22 LAB — OB RESULTS CONSOLE RPR: RPR: NONREACTIVE

## 2017-01-26 ENCOUNTER — Inpatient Hospital Stay (HOSPITAL_COMMUNITY): Payer: 59

## 2017-01-26 ENCOUNTER — Inpatient Hospital Stay (HOSPITAL_COMMUNITY)
Admission: AD | Admit: 2017-01-26 | Discharge: 2017-01-26 | Disposition: A | Discharge status: Home / Self Care | Payer: 59 | Source: Ambulatory Visit | Attending: Obstetrics and Gynecology | Admitting: Obstetrics and Gynecology

## 2017-01-26 ENCOUNTER — Encounter (HOSPITAL_COMMUNITY): Payer: Self-pay | Admitting: *Deleted

## 2017-01-26 DIAGNOSIS — Z79899 Other long term (current) drug therapy: Secondary | ICD-10-CM | POA: Diagnosis not present

## 2017-01-26 DIAGNOSIS — F419 Anxiety disorder, unspecified: Secondary | ICD-10-CM | POA: Insufficient documentation

## 2017-01-26 DIAGNOSIS — O209 Hemorrhage in early pregnancy, unspecified: Secondary | ICD-10-CM | POA: Diagnosis not present

## 2017-01-26 DIAGNOSIS — O99341 Other mental disorders complicating pregnancy, first trimester: Secondary | ICD-10-CM | POA: Diagnosis not present

## 2017-01-26 DIAGNOSIS — Z3A12 12 weeks gestation of pregnancy: Secondary | ICD-10-CM | POA: Insufficient documentation

## 2017-01-26 LAB — URINALYSIS, ROUTINE W REFLEX MICROSCOPIC
Bacteria, UA: NONE SEEN
Bilirubin Urine: NEGATIVE
GLUCOSE, UA: NEGATIVE mg/dL
Ketones, ur: NEGATIVE mg/dL
Leukocytes, UA: NEGATIVE
NITRITE: NEGATIVE
PH: 5 (ref 5.0–8.0)
PROTEIN: NEGATIVE mg/dL
SPECIFIC GRAVITY, URINE: 1.023 (ref 1.005–1.030)

## 2017-01-26 NOTE — Discharge Instructions (Signed)
Pelvic rest for the next few days. Call the office and schedule an appointment for 10 days from now per Dr. Henderson CloudHorvath

## 2017-01-26 NOTE — MAU Note (Signed)
Pt presents to MAU with complaints of vaginal bleeding approximately 30 mins ago that filled up a panty liner. Denies any pain at present. Had U/S on Wednesday and states everything was fine with baby

## 2017-01-26 NOTE — MAU Provider Note (Signed)
History     CSN: [id number]  Arrival date and time: 01/26/17 1245   First Provider Initiated Contact with Patient 01/26/17 1328      Chief Complaint  Patient presents with  . Vaginal Bleeding   HPI Marilyn Cummings 36 y.o. 9324w0d  Was at home today and felt wet.  Went to the bathroom and had bled through her underwear and then soaked a minipad at home.  Had no cramping before or after the bleeding started.  Is very frightened and tearful afraid that she will have a miscarriage.  Had an ultrasound last week in the office on Wednesday and everything was fine.  OB History    Gravida Para Term Preterm AB Living   2 1   1   1    SAB TAB Ectopic Multiple Live Births         0 1      Past Medical History:  Diagnosis Date  . Anxiety   . Hx of appendectomy   . Hx of seasonal allergies   . Medical history non-contributory     Past Surgical History:  Procedure Laterality Date  . APPENDECTOMY    . CESAREAN SECTION N/A 04/10/2015   Procedure: CESAREAN SECTION;  Surgeon: Essie HartWalda Pinn, MD;  Location: [facility];  Service: Obstetrics;  Laterality: N/A;    History reviewed. No pertinent family history.  Social History  Substance Use Topics  . Smoking status: Never Smoker  . Smokeless tobacco: Never Used  . Alcohol use No    Allergies: No Known Allergies  Prescriptions Prior to Admission  Medication Sig Dispense Refill Last Dose  . acetaminophen (TYLENOL) 325 MG tablet Take 650 mg by mouth every 6 (six) hours as needed for mild pain, moderate pain, fever or headache.   01/26/2017 at 0600  . cetirizine (ZYRTEC) 10 MG tablet Take 10 mg by mouth at bedtime.   01/25/2017 at Unknown time  . Prenatal Vit-Fe Fumarate-FA (PRENATAL MULTIVITAMIN) TABS tablet Take 3 tablets by mouth daily.    01/26/2017 at Unknown time    Review of Systems  Constitutional: Positive for fever.  Gastrointestinal: Negative for abdominal pain, nausea and vomiting.  Genitourinary: Positive for vaginal bleeding. Negative  for vaginal discharge.   Physical Exam   Blood pressure 137/83, pulse (!) 107, temperature 98.3 F (36.8 C), resp. rate 18, height 5\' 3"  (1.6 m), weight 136 lb (61.7 kg), last menstrual period 11/03/2016, unknown if currently breastfeeding.  Physical Exam  Nursing note and vitals reviewed. Constitutional: She is oriented to person, place, and time. She appears well-developed and well-nourished.  HENT:  Head: Normocephalic.  Eyes: EOM are normal.  Neck: Neck supple.  GI: Soft. There is no tenderness.  FHT heard with doppler  Genitourinary:  Genitourinary Comments: Speculum exam done - cervix appears closed, no active bleeding, small amount of dark blood in the vagina. Bimanual exam - cervix long and closed.  Musculoskeletal: Normal range of motion.  Neurological: She is alert and oriented to person, place, and time.  Skin: Skin is warm and dry.  Psychiatric: She has a normal mood and affect.    MAU Course  Procedures CLINICAL DATA:  36 year old pregnant female with bleeding. Estimated gestational age of [redacted] weeks 0 days by LMP.  EXAM: OBSTETRIC <14 WK ULTRASOUND  TECHNIQUE: Transabdominal ultrasound was performed for evaluation of the gestation as well as the maternal uterus and adnexal regions.  COMPARISON:  None.  FINDINGS: Intrauterine gestational sac: Single  Yolk sac:  Not  Visualized.  Embryo:  Visualized.  Cardiac Activity: Visualized.  Heart Rate: 167 bpm  CRL:   61  mm   12 w 4 d                  [place] EDC: 08/06/2017  Subchorionic hemorrhage:  None visualized.  Maternal uterus/adnexae: The ovaries bilaterally are unremarkable.  There is no evidence of free fluid or adnexal mass.  IMPRESSION: Single living intrauterine gestation with estimated gestational age of [redacted] weeks 4 days by this ultrasound.  No evidence of subchorionic hemorrhage or acute abnormality.  MDM Reviewed plan of care with Dr. Henderson Cloud - ultrasound ordered Preliminary  [place] results reviewed - No subchorionic hemorrhage, FHT documented.  No further vaginal bleeding this afternoon - will discharge  Blood type A Positive Discussed ultrasound results with client and she and her partner are relieved and much calmer.  No further crying.    Assessment and Plan  Vaginal bleeding in pregnancy in first trimester - cause unknown  Plan Advised to call the office with further vaginal bleeding. Pelvic rest this week. Dr. Henderson Cloud advises being seen in the office in 10 days due to being age 52 at time of delivery - check viability. No further vaginal bleeding noted since speculum exam.  Terri L Burleson 01/26/2017, 1:38 PM

## 2017-02-02 ENCOUNTER — Encounter (HOSPITAL_COMMUNITY): Payer: Self-pay

## 2017-02-02 ENCOUNTER — Inpatient Hospital Stay (HOSPITAL_COMMUNITY): Payer: 59

## 2017-02-02 ENCOUNTER — Inpatient Hospital Stay (HOSPITAL_COMMUNITY)
Admission: AD | Admit: 2017-02-02 | Discharge: 2017-02-02 | Disposition: A | Discharge status: Home / Self Care | Payer: 59 | Source: Ambulatory Visit | Attending: Obstetrics and Gynecology | Admitting: Obstetrics and Gynecology

## 2017-02-02 DIAGNOSIS — O26851 Spotting complicating pregnancy, first trimester: Secondary | ICD-10-CM

## 2017-02-02 DIAGNOSIS — Z3A13 13 weeks gestation of pregnancy: Secondary | ICD-10-CM | POA: Diagnosis not present

## 2017-02-02 DIAGNOSIS — O209 Hemorrhage in early pregnancy, unspecified: Secondary | ICD-10-CM | POA: Insufficient documentation

## 2017-02-02 DIAGNOSIS — O99342 Other mental disorders complicating pregnancy, second trimester: Secondary | ICD-10-CM | POA: Diagnosis not present

## 2017-02-02 DIAGNOSIS — R109 Unspecified abdominal pain: Secondary | ICD-10-CM | POA: Diagnosis not present

## 2017-02-02 DIAGNOSIS — O26899 Other specified pregnancy related conditions, unspecified trimester: Secondary | ICD-10-CM | POA: Diagnosis present

## 2017-02-02 DIAGNOSIS — F419 Anxiety disorder, unspecified: Secondary | ICD-10-CM | POA: Insufficient documentation

## 2017-02-02 DIAGNOSIS — O26892 Other specified pregnancy related conditions, second trimester: Secondary | ICD-10-CM | POA: Diagnosis not present

## 2017-02-02 DIAGNOSIS — O26891 Other specified pregnancy related conditions, first trimester: Secondary | ICD-10-CM | POA: Diagnosis not present

## 2017-02-02 DIAGNOSIS — Z79899 Other long term (current) drug therapy: Secondary | ICD-10-CM | POA: Diagnosis not present

## 2017-02-02 DIAGNOSIS — O4690 Antepartum hemorrhage, unspecified, unspecified trimester: Secondary | ICD-10-CM | POA: Diagnosis present

## 2017-02-02 HISTORY — DX: Antepartum hemorrhage, unspecified, unspecified trimester: O46.90

## 2017-02-02 HISTORY — DX: Unspecified abdominal pain: R10.9

## 2017-02-02 HISTORY — DX: Other specified pregnancy related conditions, unspecified trimester: O26.899

## 2017-02-02 LAB — CBC
HCT: 37.4 % (ref 36.0–46.0)
HEMOGLOBIN: 13.1 g/dL (ref 12.0–15.0)
MCH: 29.9 pg (ref 26.0–34.0)
MCHC: 35 g/dL (ref 30.0–36.0)
MCV: 85.4 fL (ref 78.0–100.0)
Platelets: 307 10*3/uL (ref 150–400)
RBC: 4.38 MIL/uL (ref 3.87–5.11)
RDW: 13.4 % (ref 11.5–15.5)
WBC: 10 10*3/uL (ref 4.0–10.5)

## 2017-02-02 NOTE — MAU Provider Note (Signed)
History     CSN: [id number]  Arrival date and time: 02/02/17 1102   First Provider Initiated Contact with Patient 02/02/17 1136      Chief Complaint  Patient presents with  . Abdominal Cramping  . Vaginal Bleeding   [name] is a 36 yo G2P0101 at 13.[redacted] wks gestation by LMP presenting with complaints of spotting since Sunday. The spotting stopped Wednesday.  Spotting returned yesterday and progressively got heavier like a period and passing clots.  She started having cramping today; this is a new issue. She has had an ultrasound 2 wks ago at [facility] and here last Sunday.  There were no findings to explain the source of bleeding.  Patient and husband really worried that something is wrong.   Abdominal Cramping  This is a new problem. The current episode started today. The onset quality is gradual. The problem occurs intermittently. The most recent episode lasted 1 week. The problem has been waxing and waning. The pain is located in the LLQ. The pain is at a severity of 5/10. The pain is mild. The quality of the pain is cramping. The abdominal pain does not radiate. The pain is aggravated by movement. The pain is relieved by being still. She has tried nothing for the symptoms. Prior diagnostic workup includes ultrasound. Her past medical history is significant for abdominal surgery (c/s 2016).  Vaginal Bleeding  The patient's primary symptoms include vaginal bleeding (spotting at times; heavy like a period with clots at times). This is a recurrent problem. The current episode started in the past 7 days. The problem occurs intermittently. The problem has been waxing and waning. The pain is mild. She is pregnant. Associated symptoms include abdominal pain (LLQ cramping started this morning ). The vaginal discharge was brown, bloody and thick. The vaginal bleeding is heavier than menses. She has been passing clots. She has not been passing tissue. Nothing aggravates the symptoms. She has tried  nothing for the symptoms. She is sexually active (not recently). No, her partner does not have an STD. She uses nothing for contraception. Her menstrual history has been regular. Her past medical history is significant for an abdominal surgery (c/s 2016) and a Cesarean section.    Past Medical History:  Diagnosis Date  . Anxiety   . Hx of appendectomy   . Hx of seasonal allergies   . Medical history non-contributory     Past Surgical History:  Procedure Laterality Date  . APPENDECTOMY    . CESAREAN SECTION N/A 04/10/2015   Procedure: CESAREAN SECTION;  Surgeon: Essie Hart, MD;  Location: [facility];  Service: Obstetrics;  Laterality: N/A;    History reviewed. No pertinent family history.  Social History  Substance Use Topics  . Smoking status: Never Smoker  . Smokeless tobacco: Never Used  . Alcohol use No    Allergies: No Known Allergies  Prescriptions Prior to Admission  Medication Sig Dispense Refill Last Dose  . acetaminophen (TYLENOL) 325 MG tablet Take 650 mg by mouth every 6 (six) hours as needed for mild pain, moderate pain, fever or headache.   02/01/2017 at Unknown time  . cetirizine (ZYRTEC) 10 MG tablet Take 10 mg by mouth at bedtime.   Past Week at Unknown time  . docusate sodium (COLACE) 100 MG capsule Take 100 mg by mouth daily as needed for mild constipation.   Past Month at Unknown time  . loratadine (CLARITIN) 10 MG tablet Take 10 mg by mouth daily.   02/01/2017  at Unknown time  . Prenatal Vit-Fe Fumarate-FA (PRENATAL MULTIVITAMIN) TABS tablet Take 3 tablets by mouth daily.    02/02/2017 at Unknown time    Review of Systems  Constitutional: Negative.   HENT: Negative.   Eyes: Negative.   Respiratory: Negative.   Cardiovascular: Negative.   Gastrointestinal: Positive for abdominal pain (LLQ cramping started this morning ).  Endocrine: Negative.   Genitourinary: Positive for vaginal bleeding.  Musculoskeletal: Negative.   Skin: Negative.    Allergic/Immunologic: Negative.   Neurological: Negative.   Hematological: Negative.   Psychiatric/Behavioral: Negative.    Physical Exam   Blood pressure 121/82, pulse 84, temperature 98.1 F (36.7 C), temperature source Oral, resp. rate 18, weight 63 kg (139 lb), last menstrual period 11/03/2016, SpO2 100 %.  Physical Exam  Constitutional: She is oriented to person, place, and time. She appears well-developed and well-nourished.  HENT:  Head: Normocephalic.  Eyes: Pupils are equal, round, and reactive to light.  Neck: Normal range of motion.  Cardiovascular: Normal rate, regular rhythm, normal heart sounds and intact distal pulses.   Respiratory: Effort normal and breath sounds normal.  GI: Soft. Bowel sounds are normal.  Genitourinary:  Genitourinary Comments: Scant amount dark, thick blood in vaginal vault, cleaned out with large swabs, no active bleeding seen from cervix, no CMT or friability.   Musculoskeletal: Normal range of motion.  Neurological: She is alert and oriented to person, place, and time. She has normal reflexes.  Skin: Skin is warm and dry.  Psychiatric: She has a normal mood and affect. Her behavior is normal. Judgment and thought content normal.    Informal bedside U/S: 130 bpm with (+) FM  Results for orders placed or performed during the hospital encounter of 02/02/17 (from the past 24 hour(s))  CBC     Status: None   Collection Time: 02/02/17 11:14 AM  Result Value Ref Range   WBC 10.0 4.0 - 10.5 K/uL   RBC 4.38 3.87 - 5.11 MIL/uL   Hemoglobin 13.1 12.0 - 15.0 g/dL   HCT 16.1 09.6 - 04.5 %   MCV 85.4 78.0 - 100.0 fL   MCH 29.9 26.0 - 34.0 pg   MCHC 35.0 30.0 - 36.0 g/dL   RDW 40.9 81.1 - 91.4 %   Platelets 307 150 - 400 K/uL   US Ob Comp Less 14 Wks  Result Date: 02/02/2017 CLINICAL DATA:  Thirteen weeks pregnant, bleeding. EXAM: OBSTETRIC <14 WK ULTRASOUND TECHNIQUE: Transabdominal ultrasound was performed for evaluation of the gestation as  well as the maternal uterus and adnexal regions. COMPARISON:  None. FINDINGS: Intrauterine gestational sac: Single Yolk sac:  Present Embryo:  Present Cardiac Activity: Present Heart Rate: 167 bpm MSD:   mm    w     d CRL:   70  mm   13 w 1 d                  [place] EDC: 08/09/2017 Subchorionic hemorrhage:  None visualized. Maternal uterus/adnexae: Maternal ovaries appear normal and there is no mass or free fluid seen within either adnexal region. Cervix appears grossly closed with a normal long axis measurement of 4.5 cm. IMPRESSION: Single live intrauterine pregnancy with estimated gestational age of [redacted] weeks and 1 day by measurements. No complicating features. Electronically Signed   By: Bary Richard M.D.   On: 02/02/2017 12:43   MAU Course  Procedures  MDM CCUA CBC OB U/S < 14 wks   Assessment and Plan  36 yo G2P0101 at 13.[redacted] wks gestation Bleeding in early pregnancy Abdominal Pain in Pregnancy  Discharge home Bleeding precautions given Pelvic Rest Reassurance given that pregnancy is healthy at this time / explained there was still no finding on ultrasound to indicate source of bleeding  Call [facility] for any increased bleeding and/or pain  Raelyn Moraolitta Timberlynn Kizziah MSN, CNM 02/02/2017, 11:45 AM

## 2017-02-02 NOTE — Progress Notes (Signed)
Patient very tearful and worried, husband at bedside stating they just want answers. I explained the plan of care with the patient and husband, both verbalized an understanding.

## 2017-02-02 NOTE — Discharge Instructions (Signed)
Vaginal Bleeding During Pregnancy, First Trimester °A small amount of bleeding (spotting) from the vagina is relatively common in early pregnancy. It usually stops on its own. Various things may cause bleeding or spotting in early pregnancy. Some bleeding may be related to the pregnancy, and some may not. In most cases, the bleeding is normal and is not a problem. However, bleeding can also be a sign of something serious. Be sure to tell your health care provider about any vaginal bleeding right away. °Some possible causes of vaginal bleeding during the first trimester include: °· Infection or inflammation of the cervix. °· Growths (polyps) on the cervix. °· Miscarriage or threatened miscarriage. °· Pregnancy tissue has developed outside of the uterus and in a fallopian tube (tubal pregnancy). °· Tiny cysts have developed in the uterus instead of pregnancy tissue (molar pregnancy). °Follow these instructions at home: °Watch your condition for any changes. The following actions may help to lessen any discomfort you are feeling: °· Follow your health care provider's instructions for limiting your activity. If your health care provider orders bed rest, you may need to stay in bed and only get up to use the bathroom. However, your health care provider may allow you to continue light activity. °· If needed, make plans for someone to help with your regular activities and responsibilities while you are on bed rest. °· Keep track of the number of pads you use each day, how often you change pads, and how soaked (saturated) they are. Write this down. °· Do not use tampons. Do not douche. °· Do not have sexual intercourse or orgasms until approved by your health care provider. °· If you pass any tissue from your vagina, save the tissue so you can show it to your health care provider. °· Only take over-the-counter or prescription medicines as directed by your health care provider. °· Do not take aspirin because it can make you  bleed. °· Keep all follow-up appointments as directed by your health care provider. °Contact a health care provider if: °· You have any vaginal bleeding during any part of your pregnancy. °· You have cramps or labor pains. °· You have a fever, not controlled by medicine. °Get help right away if: °· You have severe cramps in your back or belly (abdomen). °· You pass large clots or tissue from your vagina. °· Your bleeding increases. °· You feel light-headed or weak, or you have fainting episodes. °· You have chills. °· You are leaking fluid or have a gush of fluid from your vagina. °· You pass out while having a bowel movement. °This information is not intended to replace advice given to you by your health care provider. Make sure you discuss any questions you have with your health care provider. °Document Released: 06/19/2005 Document Revised: 02/15/2016 Document Reviewed: 05/17/2013 °Elsevier Interactive Patient Education © 2017 [employer] ° °

## 2017-02-02 NOTE — MAU Note (Signed)
Continued to spot, brownish through Wed.  Started spotting again yesterday, became heavier- more like period, and is now cramping.

## 2017-06-14 IMAGING — US US OB COMP LESS 14 WK
1 series · 15 of 28 positions shown · non-contrast
Comparison: None.

CLINICAL DATA: 35-year-old pregnant female with bleeding. Estimated
gestational age of 12 weeks 0 days by LMP.

EXAM:
OBSTETRIC <14 WK ULTRASOUND
TECHNIQUE: Transabdominal ultrasound was performed for evaluation of the
gestation as well as the maternal uterus and adnexal regions.

[Series 1: us ob comp less 14 wk · 15 of 28 slices shown]
[im 1/28]
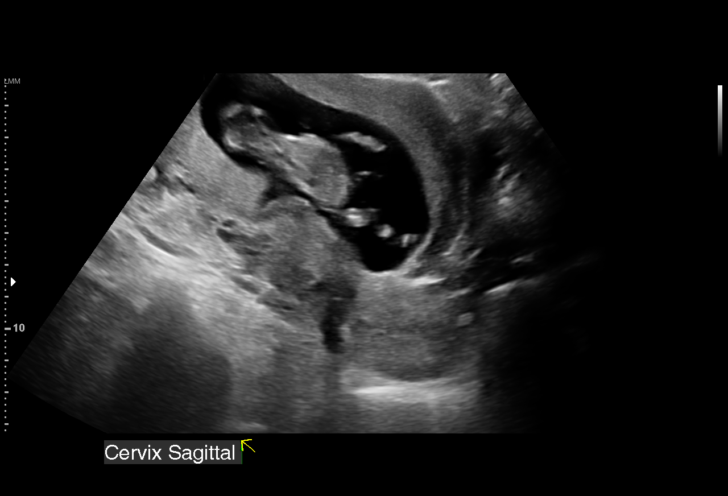
[im 3/28]
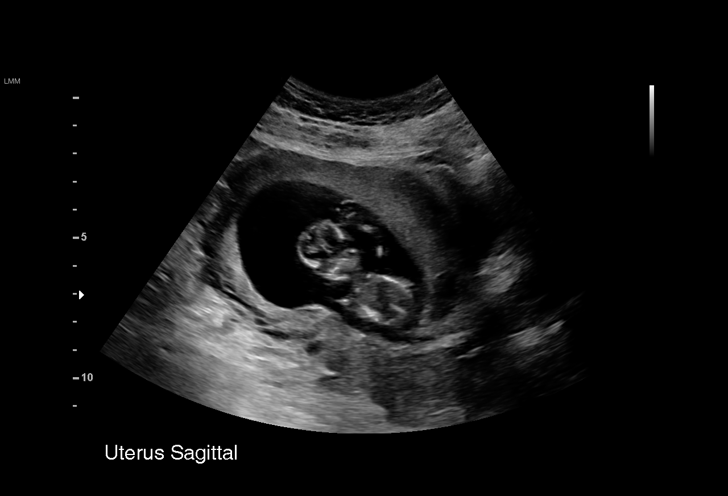
[im 5/28]
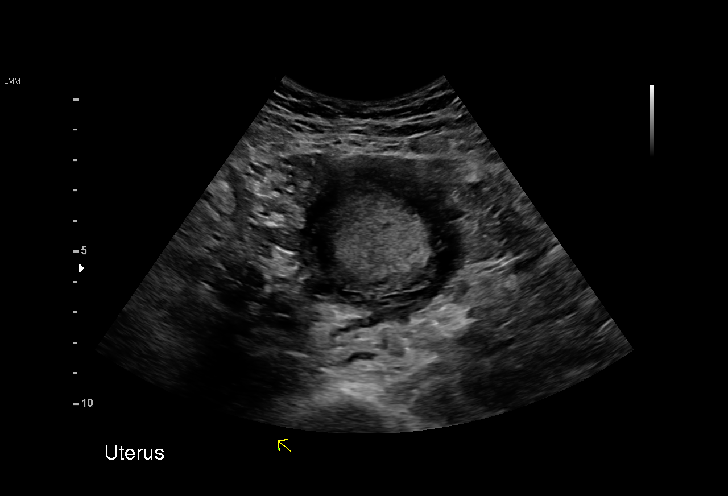
[im 7/28]
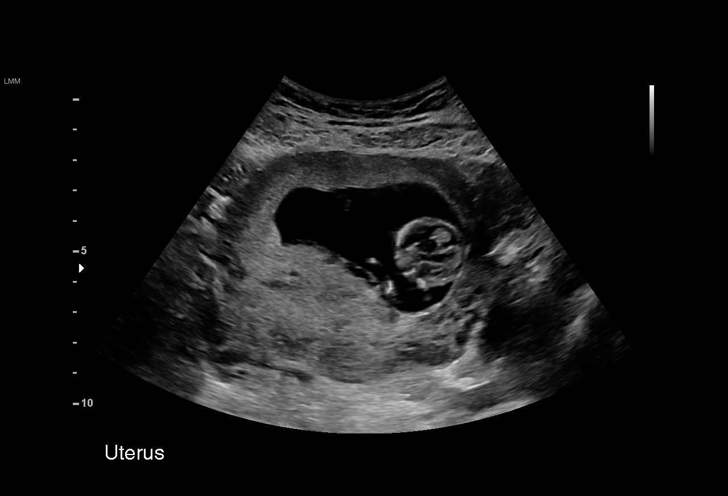
[im 9/28]
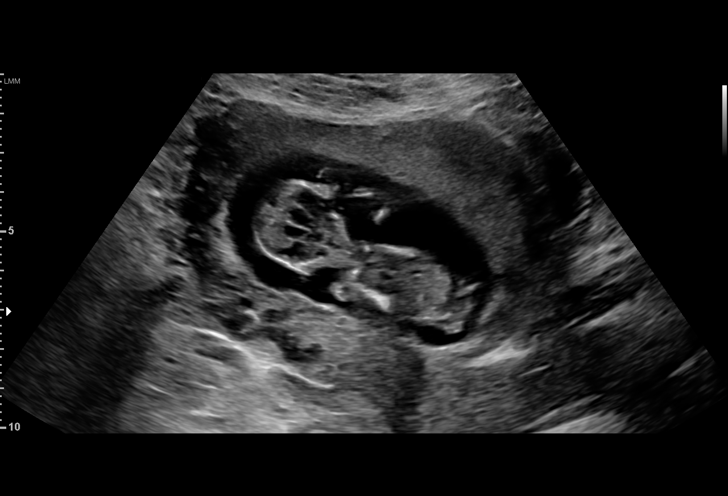
[im 11/28]
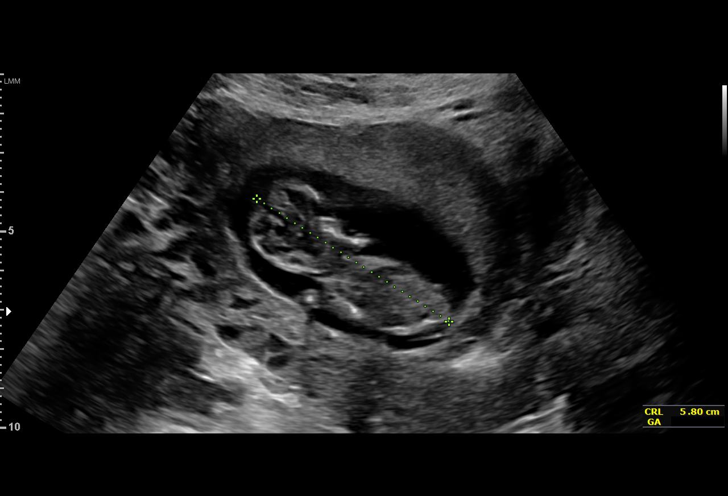
[im 13/28]
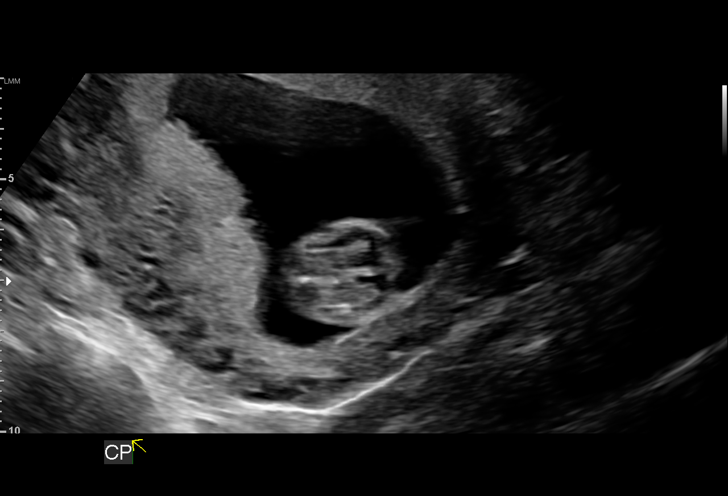
[im 15/28]
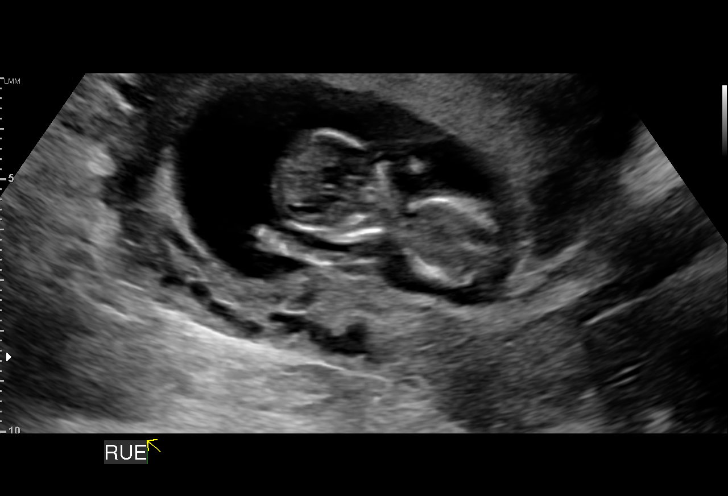
[im 16/28]
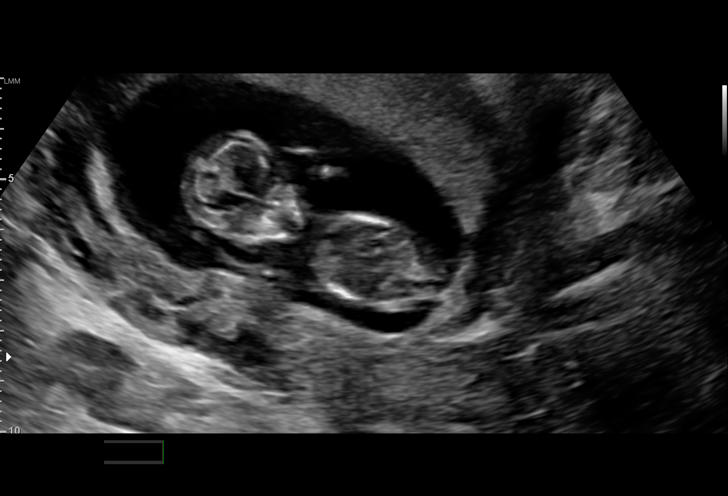
[im 18/28]
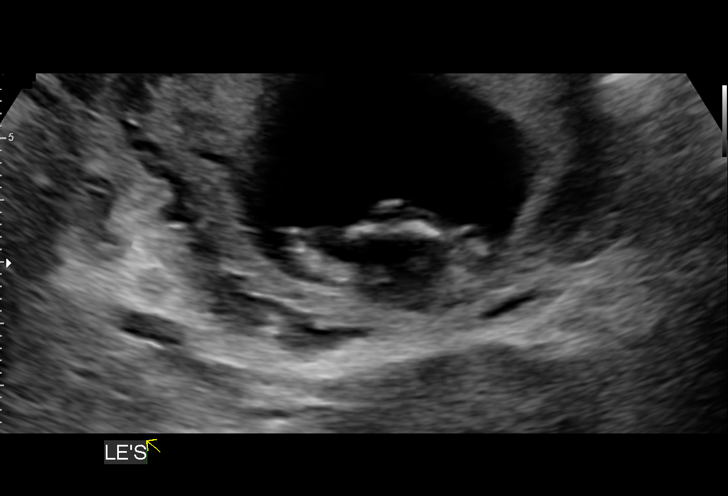
[im 20/28]
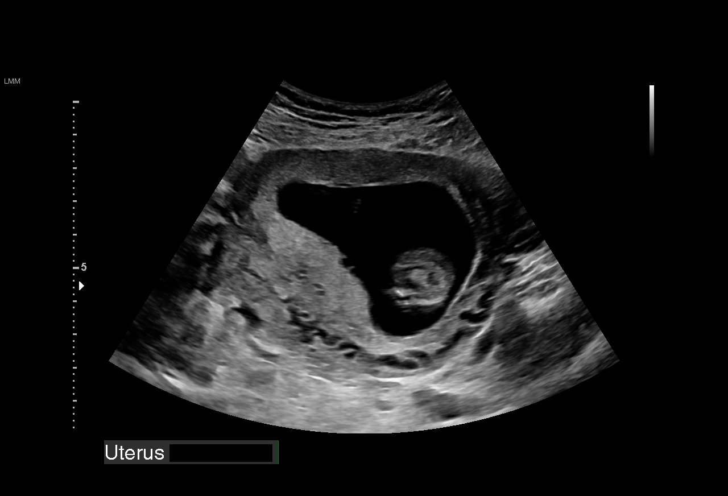
[im 22/28]
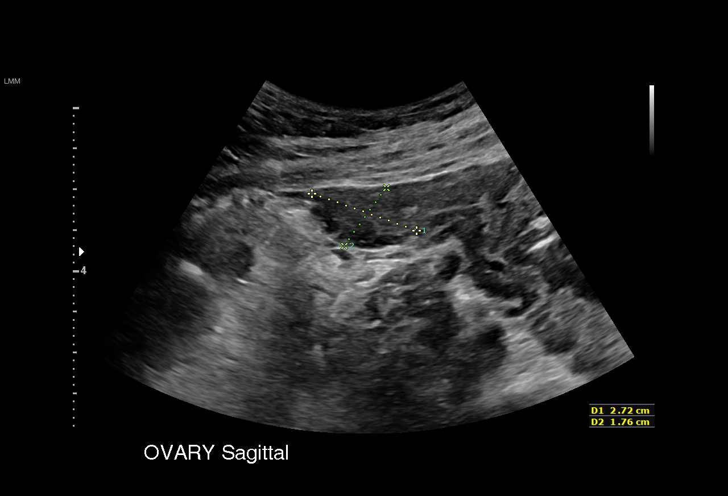
[im 24/28]
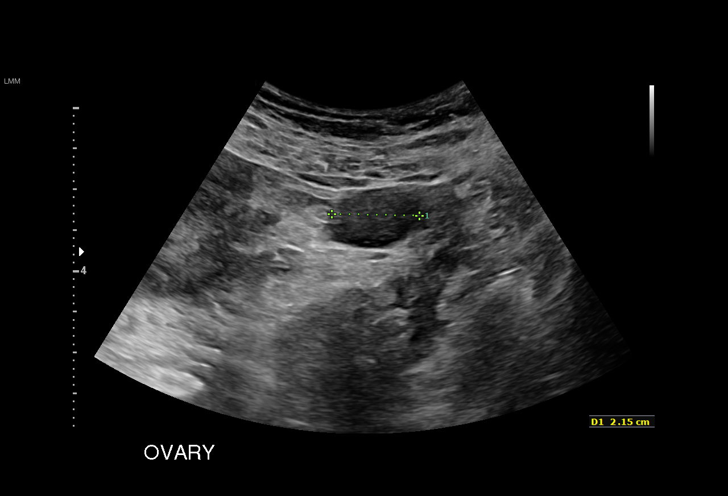
[im 26/28]
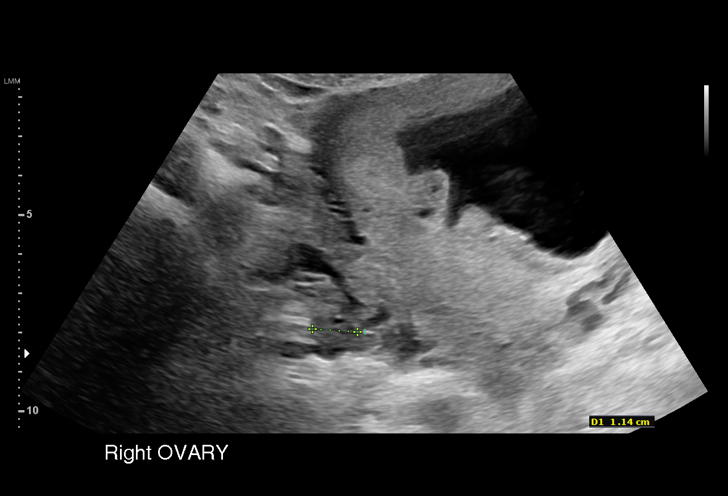
[im 28/28]
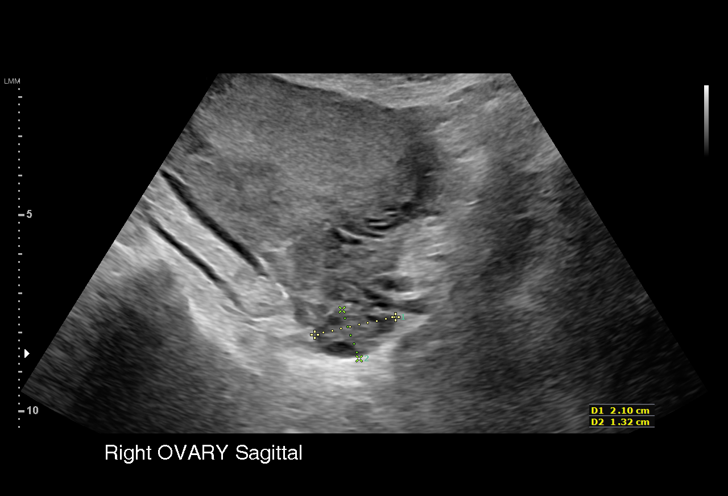

[15 of 28 positions shown; findings below may reference images not displayed]

FINDINGS: Intrauterine gestational sac: Single

Yolk sac:  Not Visualized.

Embryo:  Visualized.

Cardiac Activity: Visualized.

Heart Rate: 167 bpm

CRL:   61  mm   12 w 4 d                  US EDC: 08/06/2017

Subchorionic hemorrhage:  None visualized.

Maternal uterus/adnexae: The ovaries bilaterally are unremarkable.

There is no evidence of free fluid or adnexal mass.
IMPRESSION: Single living intrauterine gestation with estimated gestational age
of 12 weeks 4 days by this ultrasound.

No evidence of subchorionic hemorrhage or acute abnormality.

## 2017-06-21 IMAGING — US US OB COMP LESS 14 WK
1 series · 16 of 28 positions shown · non-contrast
Comparison: None.

CLINICAL DATA: Thirteen weeks pregnant, bleeding.

EXAM:
OBSTETRIC <14 WK ULTRASOUND
TECHNIQUE: Transabdominal ultrasound was performed for evaluation of the
gestation as well as the maternal uterus and adnexal regions.

[Series 1: us ob comp less 14 wk · 28 acquisitions, 16 frames shown]
[im 1/28]
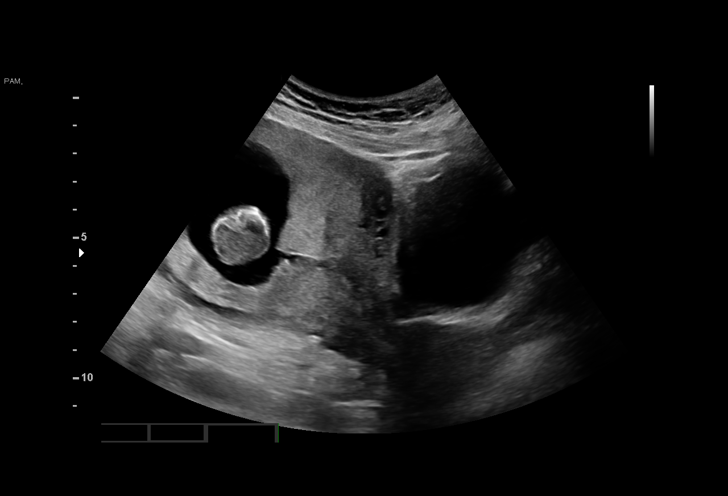
[im 3/28]
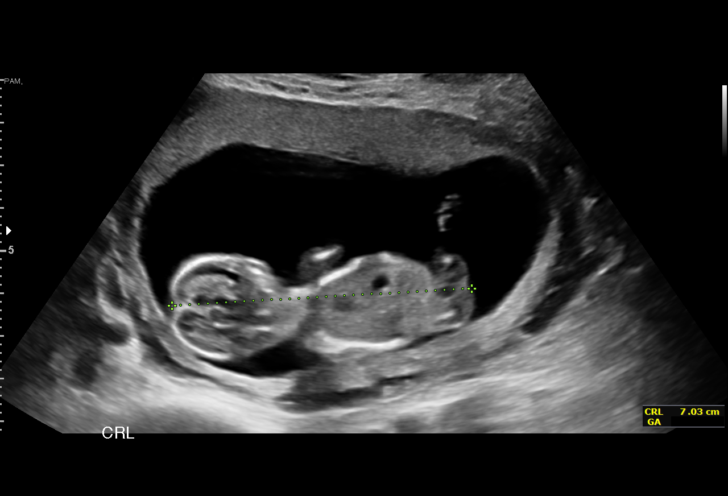
[im 5/28]
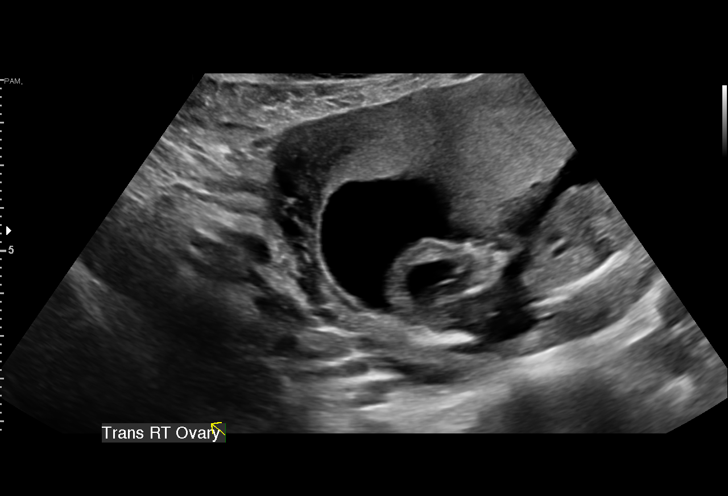
[im 7/28]
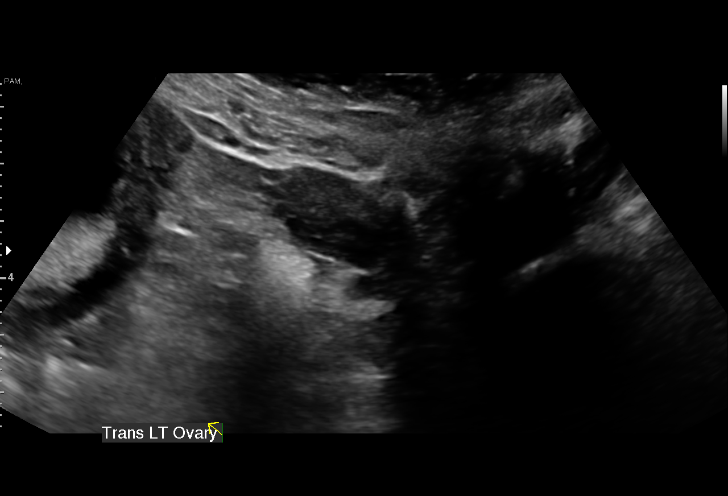
[im 8/28]
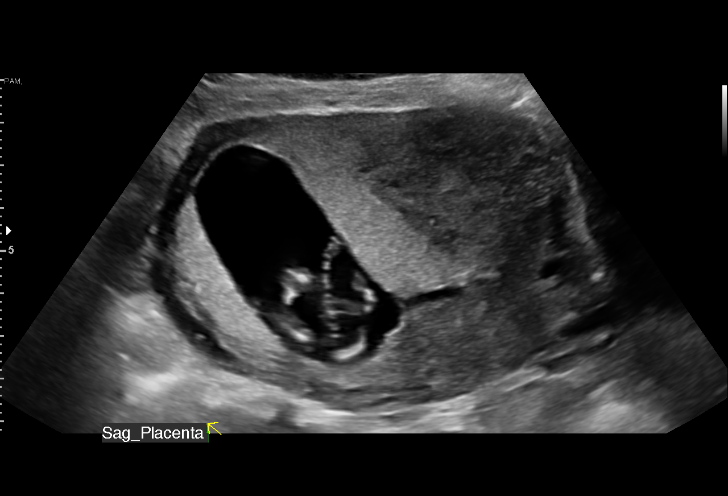
[im 10/28]
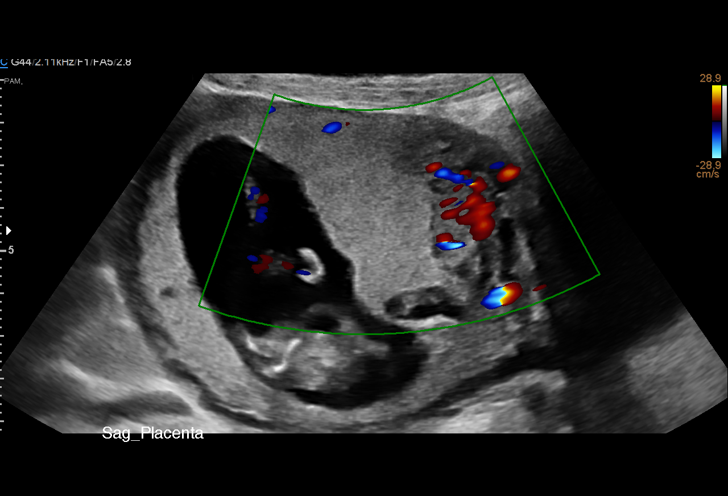
[im 12/28]
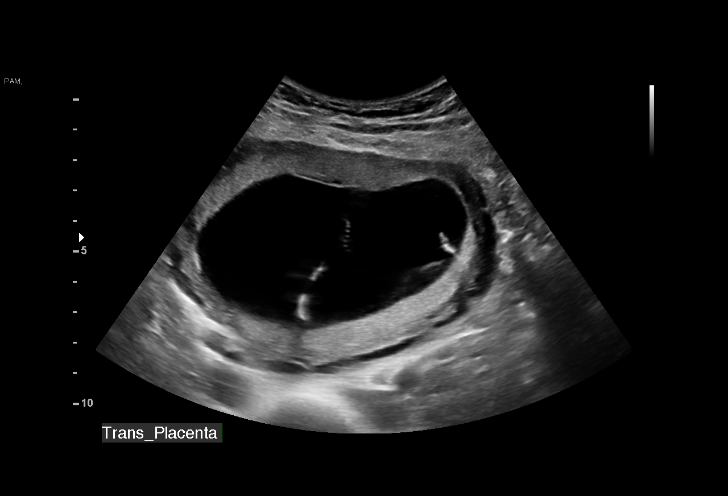
[im 14/28]
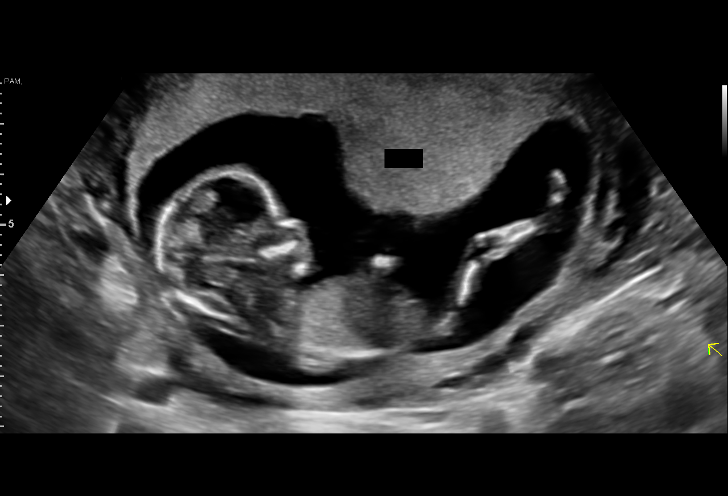
[im 15/28]
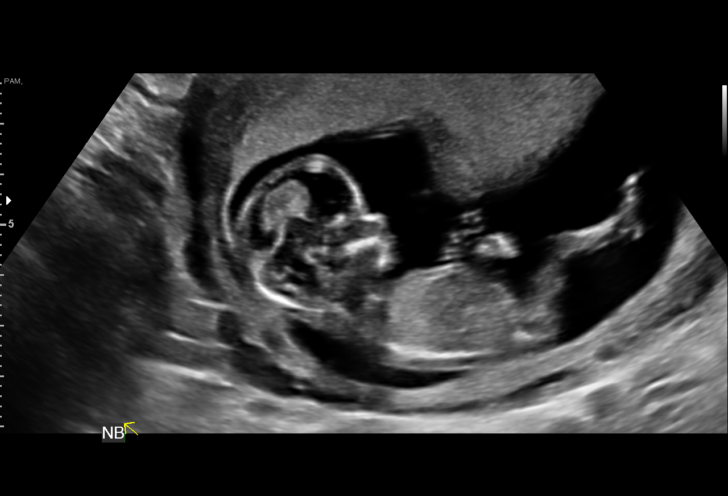
[im 17/28]
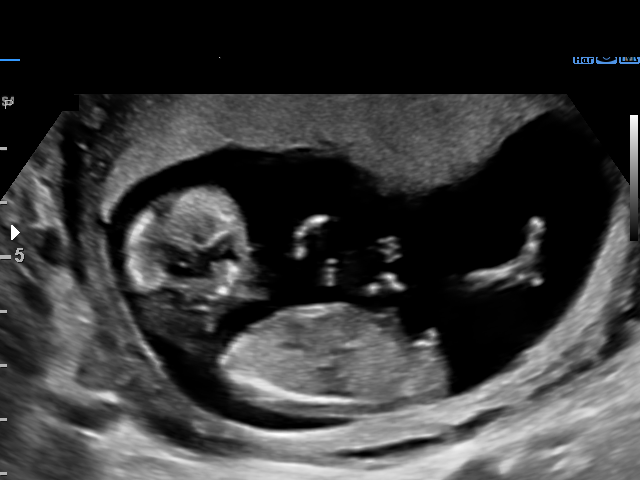
[im 19/28]
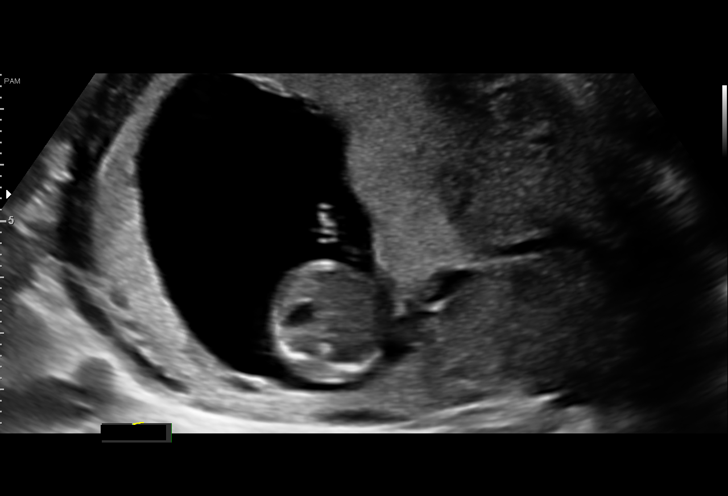
[im 21/28]
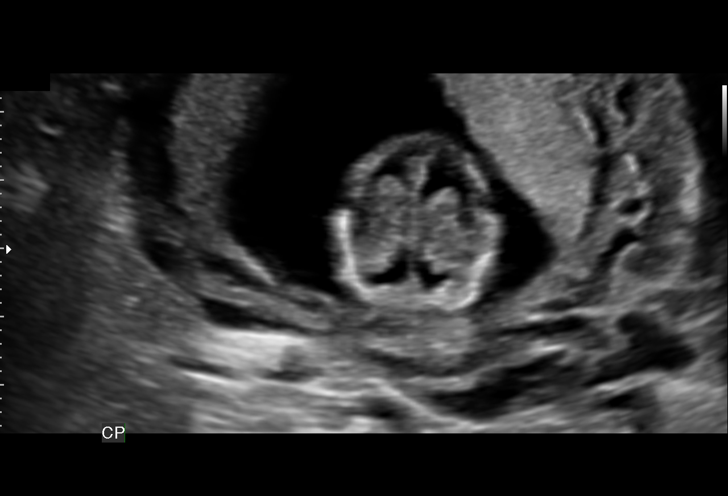
[im 22/28]
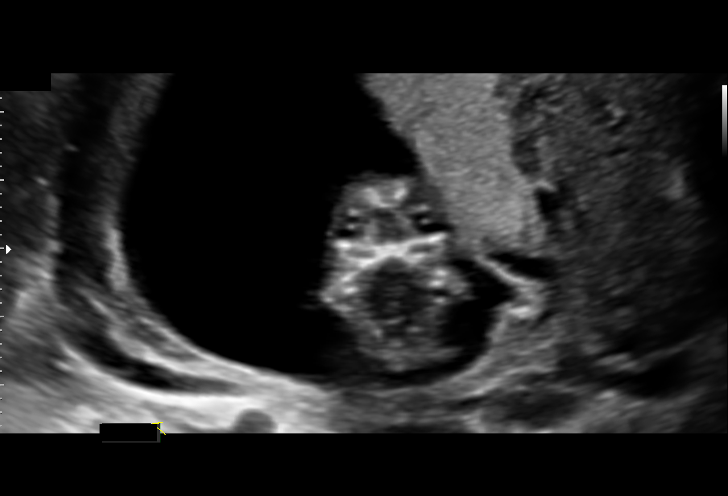
[im 24/28]
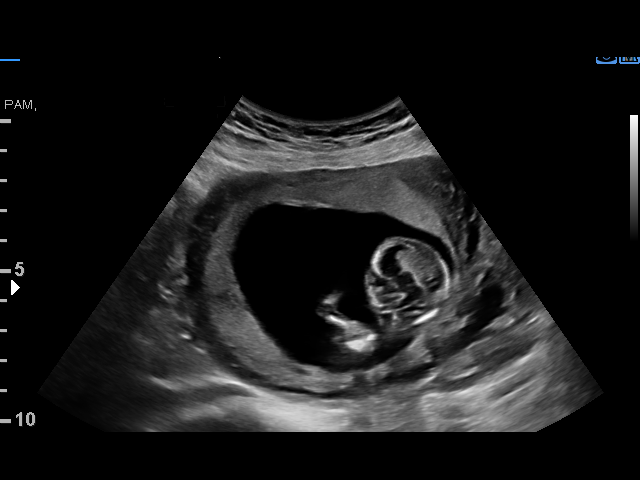
[im 26/28]
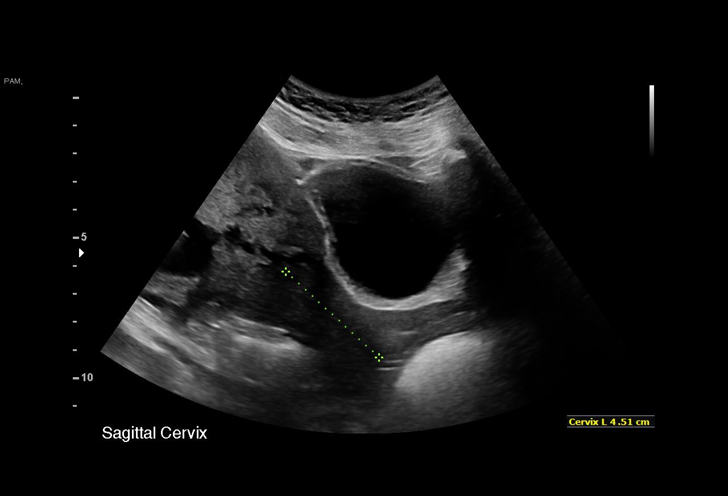
[im 28/28]
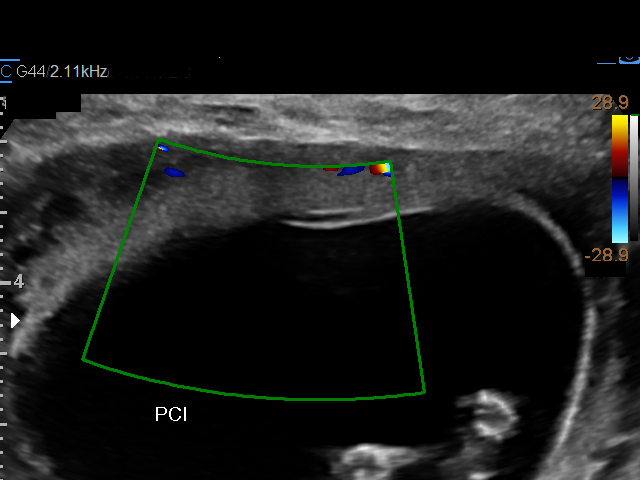

[16 of 28 positions shown; findings below may reference images not displayed]

FINDINGS: Intrauterine gestational sac: Single

Yolk sac:  Present

Embryo:  Present

Cardiac Activity: Present

Heart Rate: 167 bpm

MSD:   mm    w     d

CRL:   70  mm   13 w 1 d                  US EDC: 08/09/2017

Subchorionic hemorrhage:  None visualized.

Maternal uterus/adnexae: Maternal ovaries appear normal and there is
no mass or free fluid seen within either adnexal region. Cervix
appears grossly closed with a normal long axis measurement of
cm.
IMPRESSION: Single live intrauterine pregnancy with estimated gestational age of
13 weeks and 1 day by measurements. No complicating features.

## 2017-07-11 LAB — OB RESULTS CONSOLE GBS: STREP GROUP B AG: POSITIVE

## 2017-08-05 ENCOUNTER — Other Ambulatory Visit: Payer: Self-pay | Admitting: Obstetrics and Gynecology

## 2017-08-05 NOTE — H&P (Signed)
36 y.o.  G2P0101 5133w2d comes in for a repeat cesarean section at term.  Patient has good fetal movement and no bleeding.    Past Medical History:  Diagnosis Date  . Abdominal cramping affecting pregnancy 02/02/2017  . Abdominal pain affecting pregnancy 02/02/2017  . Anxiety   . Hx of appendectomy   . Hx of seasonal allergies   . Medical history non-contributory   . Vaginal bleeding in pregnancy 02/02/2017    Past Surgical History:  Procedure Laterality Date  . APPENDECTOMY      OB History  Gravida Para Term Preterm AB Living  2 1   1   1   SAB TAB Ectopic Multiple Live Births        0 1    # Outcome Date GA Lbr Len/2nd Weight Sex Delivery Anes PTL Lv  2 Current           1 Preterm 04/10/15 3251w6d  5 lb 13.7 oz (2.655 kg) F CS-LTranv Spinal  LIV     Complications: Delivery by cesarean section for breech presentation      Social History   Socioeconomic History  . Marital status: Married    Spouse name: Not on file  . Number of children: Not on file  . Years of education: Not on file  . Highest education level: Not on file  Social Needs  . Financial resource strain: Not on file  . Food insecurity - worry: Not on file  . Food insecurity - inability: Not on file  . Transportation needs - medical: Not on file  . Transportation needs - non-medical: Not on file  Occupational History  . Not on file  Tobacco Use  . Smoking status: Never Smoker  . Smokeless tobacco: Never Used  Substance and Sexual Activity  . Alcohol use: No  . Drug use: No  . Sexual activity: Yes  Other Topics Concern  . Not on file  Social History Narrative  . Not on file   Patient has no known allergies.   Prenatal Course: uncomplicated.   Prenatal Transfer Tool  Maternal Diabetes: No Genetic Screening: Normal Maternal Ultrasounds/Referrals: Normal Fetal Ultrasounds or other Referrals:  None Maternal Substance Abuse:  No Significant Maternal Medications:  None Significant Maternal Lab Results:  None  There were no vitals filed for this visit.  Lungs/Cor:  NAD Abdomen:  soft, gravid Ex:  no cords, erythema SVE:  NA FHTs:  present  A/P   For repeat cesarean sectionat term.  All risks, benefits and alternatives discussed with patient and she desires to proceed.  Marilyn Cummings A

## 2017-08-06 ENCOUNTER — Telehealth (HOSPITAL_COMMUNITY): Payer: Self-pay | Admitting: *Deleted

## 2017-08-06 ENCOUNTER — Encounter (HOSPITAL_COMMUNITY)
Admission: RE | Admit: 2017-08-06 | Discharge: 2017-08-06 | Disposition: A | Discharge status: Home / Self Care | Payer: 59 | Source: Ambulatory Visit

## 2017-08-06 ENCOUNTER — Encounter (HOSPITAL_COMMUNITY): Payer: Self-pay

## 2017-08-06 HISTORY — DX: Supervision of elderly multigravida, unspecified trimester: O09.529

## 2017-08-06 NOTE — Telephone Encounter (Signed)
Preadmission screen  

## 2017-08-06 NOTE — Patient Instructions (Signed)
Marchelle GearingDanijela V Burtch  08/06/2017   Your procedure is scheduled on:  08/07/2017  [facility] at 0530 AM.    Call this number if you have problems the morning of [phone]  Remember:   Do not eat food:After Midnight.  Do not drink clear liquids: After Midnight.  Take these medicines the morning of surgery with A SIP OF WATER: none   Do not wear jewelry, make-up or nail polish.  Do not wear lotions, powders, or perfumes. Do not wear deodorant.  Do not shave 48 hours prior to surgery.  Do not bring valuables to the hospital.  [facility] is not   responsible for any belongings or valuables brought to the hospital.  Contacts, dentures or bridgework may not be worn into surgery.  Leave suitcase in the car. After surgery it may be brought to your room.  For patients admitted to the hospital, checkout time is 11:00 AM the day of              discharge.    N/A   Please read over the following fact sheets that you were given:   Surgical Site Infection Prevention

## 2017-08-07 ENCOUNTER — Encounter (HOSPITAL_COMMUNITY): Payer: Self-pay | Admitting: *Deleted

## 2017-08-07 ENCOUNTER — Inpatient Hospital Stay (HOSPITAL_COMMUNITY): Payer: 59 | Admitting: Anesthesiology

## 2017-08-07 ENCOUNTER — Encounter (HOSPITAL_COMMUNITY)
Admission: RE | Disposition: A | Discharge status: Home / Self Care | Payer: Self-pay | Source: Ambulatory Visit | Attending: Obstetrics and Gynecology

## 2017-08-07 ENCOUNTER — Inpatient Hospital Stay (HOSPITAL_COMMUNITY)
Admission: RE | Admit: 2017-08-07 | Discharge: 2017-08-09 | DRG: 788 | Disposition: A | Discharge status: Home / Self Care | Payer: 59 | Source: Ambulatory Visit | Attending: Obstetrics and Gynecology | Admitting: Obstetrics and Gynecology

## 2017-08-07 DIAGNOSIS — O34211 Maternal care for low transverse scar from previous cesarean delivery: Secondary | ICD-10-CM | POA: Diagnosis present

## 2017-08-07 DIAGNOSIS — Z9889 Other specified postprocedural states: Secondary | ICD-10-CM

## 2017-08-07 DIAGNOSIS — Z3A39 39 weeks gestation of pregnancy: Secondary | ICD-10-CM | POA: Diagnosis not present

## 2017-08-07 LAB — TYPE AND SCREEN
ABO/RH(D): A POS
ANTIBODY SCREEN: NEGATIVE

## 2017-08-07 LAB — CBC
HEMATOCRIT: 38.9 % (ref 36.0–46.0)
Hemoglobin: 12.9 g/dL (ref 12.0–15.0)
MCH: 29.3 pg (ref 26.0–34.0)
MCHC: 33.2 g/dL (ref 30.0–36.0)
MCV: 88.4 fL (ref 78.0–100.0)
PLATELETS: 270 10*3/uL (ref 150–400)
RBC: 4.4 MIL/uL (ref 3.87–5.11)
RDW: 13.8 % (ref 11.5–15.5)
WBC: 11.5 10*3/uL — AB (ref 4.0–10.5)

## 2017-08-07 SURGERY — Surgical Case
Anesthesia: Spinal

## 2017-08-07 MED ORDER — ZOLPIDEM TARTRATE 5 MG PO TABS: 5.0000 mg | ORAL_TABLET | Freq: Every evening | ORAL | Status: DC | PRN

## 2017-08-07 MED ORDER — WITCH HAZEL-GLYCERIN EX PADS: 1.0000 "application " | MEDICATED_PAD | CUTANEOUS | Status: DC | PRN

## 2017-08-07 MED ORDER — METOCLOPRAMIDE HCL 5 MG/ML IJ SOLN
INTRAMUSCULAR | Status: DC | PRN
  Administered 2017-08-07: 10 mg via INTRAVENOUS

## 2017-08-07 MED ORDER — ACETAMINOPHEN 325 MG PO TABS
650.0000 mg | ORAL_TABLET | ORAL | Status: DC | PRN
Start: 2017-08-07 — End: 2017-08-09
  Administered 2017-08-07 – 2017-08-09 (×2): 650 mg via ORAL
  Filled 2017-08-07 (×2): qty 2

## 2017-08-07 MED ORDER — SCOPOLAMINE 1 MG/3DAYS TD PT72
1.0000 | MEDICATED_PATCH | Freq: Once | TRANSDERMAL | Status: DC
  Filled 2017-08-07: qty 1

## 2017-08-07 MED ORDER — MORPHINE SULFATE (PF) 0.5 MG/ML IJ SOLN
INTRAMUSCULAR | Status: AC
  Filled 2017-08-07: qty 10

## 2017-08-07 MED ORDER — IBUPROFEN 800 MG PO TABS
800.0000 mg | ORAL_TABLET | Freq: Three times a day (TID) | ORAL | Status: DC
  Administered 2017-08-07 – 2017-08-09 (×6): 800 mg via ORAL
  Filled 2017-08-07 (×7): qty 1

## 2017-08-07 MED ORDER — LACTATED RINGERS IV SOLN
INTRAVENOUS | Status: DC
  Administered 2017-08-07: 06:00:00 via INTRAVENOUS

## 2017-08-07 MED ORDER — OXYCODONE HCL 5 MG PO TABS
5.0000 mg | ORAL_TABLET | ORAL | Status: DC | PRN
  Administered 2017-08-08 – 2017-08-09 (×9): 5 mg via ORAL
  Filled 2017-08-07 (×10): qty 1

## 2017-08-07 MED ORDER — METHYLERGONOVINE MALEATE 0.2 MG/ML IJ SOLN: 0.2000 mg | INTRAMUSCULAR | Status: DC | PRN

## 2017-08-07 MED ORDER — SENNOSIDES-DOCUSATE SODIUM 8.6-50 MG PO TABS
2.0000 | ORAL_TABLET | ORAL | Status: DC
  Administered 2017-08-07 – 2017-08-08 (×2): 2 via ORAL
  Filled 2017-08-07 (×2): qty 2

## 2017-08-07 MED ORDER — FAMOTIDINE 20 MG PO TABS: 20.0000 mg | ORAL_TABLET | Freq: Once | ORAL | Status: DC

## 2017-08-07 MED ORDER — MORPHINE SULFATE (PF) 0.5 MG/ML IJ SOLN
INTRAMUSCULAR | Status: DC | PRN
  Administered 2017-08-07: .2 mg via INTRATHECAL

## 2017-08-07 MED ORDER — SODIUM CHLORIDE 0.9 % IR SOLN
Status: DC | PRN
  Administered 2017-08-07: 1

## 2017-08-07 MED ORDER — FLEET ENEMA 7-19 GM/118ML RE ENEM: 1.0000 | ENEMA | Freq: Every day | RECTAL | Status: DC | PRN

## 2017-08-07 MED ORDER — BISACODYL 10 MG RE SUPP: 10.0000 mg | Freq: Every day | RECTAL | Status: DC | PRN

## 2017-08-07 MED ORDER — FAMOTIDINE IN NACL 20-0.9 MG/50ML-% IV SOLN
INTRAVENOUS | Status: AC
  Administered 2017-08-07: 20 mg
  Filled 2017-08-07: qty 50

## 2017-08-07 MED ORDER — DIBUCAINE 1 % RE OINT: 1.0000 "application " | TOPICAL_OINTMENT | RECTAL | Status: DC | PRN

## 2017-08-07 MED ORDER — FAMOTIDINE IN NACL 20-0.9 MG/50ML-% IV SOLN: 20.0000 mg | Freq: Once | INTRAVENOUS | Status: DC

## 2017-08-07 MED ORDER — ONDANSETRON HCL 4 MG/2ML IJ SOLN
INTRAMUSCULAR | Status: AC
  Filled 2017-08-07: qty 2

## 2017-08-07 MED ORDER — FENTANYL CITRATE (PF) 100 MCG/2ML IJ SOLN
INTRAMUSCULAR | Status: DC | PRN
  Administered 2017-08-07: 10 ug via INTRATHECAL

## 2017-08-07 MED ORDER — DEXAMETHASONE SODIUM PHOSPHATE 4 MG/ML IJ SOLN
INTRAMUSCULAR | Status: DC | PRN
  Administered 2017-08-07: 4 mg via INTRAVENOUS

## 2017-08-07 MED ORDER — OXYCODONE HCL 5 MG PO TABS: 10.0000 mg | ORAL_TABLET | ORAL | Status: DC | PRN

## 2017-08-07 MED ORDER — LORATADINE 10 MG PO TABS: 10.0000 mg | ORAL_TABLET | Freq: Every day | ORAL | Status: DC

## 2017-08-07 MED ORDER — FENTANYL CITRATE (PF) 100 MCG/2ML IJ SOLN
INTRAMUSCULAR | Status: AC
  Filled 2017-08-07: qty 2

## 2017-08-07 MED ORDER — DIPHENHYDRAMINE HCL 25 MG PO CAPS: 25.0000 mg | ORAL_CAPSULE | Freq: Four times a day (QID) | ORAL | Status: DC | PRN

## 2017-08-07 MED ORDER — CEFAZOLIN SODIUM-DEXTROSE 2-4 GM/100ML-% IV SOLN
2.0000 g | INTRAVENOUS | Status: AC
  Administered 2017-08-07: 2 g via INTRAVENOUS
  Filled 2017-08-07 (×2): qty 100

## 2017-08-07 MED ORDER — ONDANSETRON HCL 4 MG/2ML IJ SOLN
INTRAMUSCULAR | Status: DC | PRN
  Administered 2017-08-07: 4 mg via INTRAVENOUS

## 2017-08-07 MED ORDER — OXYCODONE HCL 5 MG/5ML PO SOLN: 5.0000 mg | Freq: Once | ORAL | Status: DC | PRN

## 2017-08-07 MED ORDER — HYDROMORPHONE HCL 1 MG/ML IJ SOLN
INTRAMUSCULAR | Status: AC
  Filled 2017-08-07: qty 0.5

## 2017-08-07 MED ORDER — OXYCODONE HCL 5 MG PO TABS: 5.0000 mg | ORAL_TABLET | Freq: Once | ORAL | Status: DC | PRN

## 2017-08-07 MED ORDER — HYDROMORPHONE HCL 1 MG/ML IJ SOLN
0.2500 mg | INTRAMUSCULAR | Status: DC | PRN
  Administered 2017-08-07: 0.5 mg via INTRAVENOUS

## 2017-08-07 MED ORDER — BUPIVACAINE IN DEXTROSE 0.75-8.25 % IT SOLN
INTRATHECAL | Status: DC | PRN
  Administered 2017-08-07: 1.5 mL via INTRATHECAL

## 2017-08-07 MED ORDER — SIMETHICONE 80 MG PO CHEW: 80.0000 mg | CHEWABLE_TABLET | ORAL | Status: DC | PRN

## 2017-08-07 MED ORDER — PHENYLEPHRINE 8 MG IN D5W 100 ML (0.08MG/ML) PREMIX OPTIME
INJECTION | INTRAVENOUS | Status: DC | PRN
  Administered 2017-08-07: 80 ug/min via INTRAVENOUS

## 2017-08-07 MED ORDER — FERROUS SULFATE 325 (65 FE) MG PO TABS
325.0000 mg | ORAL_TABLET | Freq: Two times a day (BID) | ORAL | Status: DC
  Administered 2017-08-07 – 2017-08-09 (×4): 325 mg via ORAL
  Filled 2017-08-07 (×5): qty 1

## 2017-08-07 MED ORDER — OXYTOCIN 40 UNITS IN LACTATED RINGERS INFUSION - SIMPLE MED: 2.5000 [IU]/h | INTRAVENOUS | Status: AC

## 2017-08-07 MED ORDER — SIMETHICONE 80 MG PO CHEW
80.0000 mg | CHEWABLE_TABLET | Freq: Three times a day (TID) | ORAL | Status: DC
  Administered 2017-08-07 – 2017-08-09 (×5): 80 mg via ORAL
  Filled 2017-08-07 (×6): qty 1

## 2017-08-07 MED ORDER — LACTATED RINGERS IV SOLN
INTRAVENOUS | Status: DC | PRN
  Administered 2017-08-07: 08:00:00 via INTRAVENOUS

## 2017-08-07 MED ORDER — MEASLES, MUMPS & RUBELLA VAC ~~LOC~~ INJ
0.5000 mL | INJECTION | Freq: Once | SUBCUTANEOUS | Status: DC
  Filled 2017-08-07: qty 0.5

## 2017-08-07 MED ORDER — METOCLOPRAMIDE HCL 5 MG/ML IJ SOLN
INTRAMUSCULAR | Status: AC
  Filled 2017-08-07: qty 2

## 2017-08-07 MED ORDER — LACTATED RINGERS IV SOLN
INTRAVENOUS | Status: DC
  Administered 2017-08-07 – 2017-08-08 (×2): via INTRAVENOUS

## 2017-08-07 MED ORDER — COCONUT OIL OIL: 1.0000 "application " | TOPICAL_OIL | Status: DC | PRN

## 2017-08-07 MED ORDER — PHENYLEPHRINE 8 MG IN D5W 100 ML (0.08MG/ML) PREMIX OPTIME
INJECTION | INTRAVENOUS | Status: AC
  Filled 2017-08-07: qty 100

## 2017-08-07 MED ORDER — METHYLERGONOVINE MALEATE 0.2 MG PO TABS: 0.2000 mg | ORAL_TABLET | ORAL | Status: DC | PRN

## 2017-08-07 MED ORDER — MENTHOL 3 MG MT LOZG: 1.0000 | LOZENGE | OROMUCOSAL | Status: DC | PRN

## 2017-08-07 MED ORDER — SIMETHICONE 80 MG PO CHEW
80.0000 mg | CHEWABLE_TABLET | ORAL | Status: DC
  Administered 2017-08-07 – 2017-08-08 (×2): 80 mg via ORAL
  Filled 2017-08-07 (×2): qty 1

## 2017-08-07 MED ORDER — SCOPOLAMINE 1 MG/3DAYS TD PT72
MEDICATED_PATCH | TRANSDERMAL | Status: AC
  Administered 2017-08-07: 1.5 mg
  Filled 2017-08-07: qty 1

## 2017-08-07 MED ORDER — TETANUS-DIPHTH-ACELL PERTUSSIS 5-2.5-18.5 LF-MCG/0.5 IM SUSP: 0.5000 mL | Freq: Once | INTRAMUSCULAR | Status: DC

## 2017-08-07 MED ORDER — SOD CITRATE-CITRIC ACID 500-334 MG/5ML PO SOLN
30.0000 mL | Freq: Once | ORAL | Status: AC
  Administered 2017-08-07: 30 mL via ORAL

## 2017-08-07 MED ORDER — DEXAMETHASONE SODIUM PHOSPHATE 4 MG/ML IJ SOLN
INTRAMUSCULAR | Status: AC
  Filled 2017-08-07: qty 1

## 2017-08-07 MED ORDER — PROMETHAZINE HCL 25 MG/ML IJ SOLN: 6.2500 mg | INTRAMUSCULAR | Status: DC | PRN

## 2017-08-07 MED ORDER — OXYTOCIN 10 UNIT/ML IJ SOLN
INTRAMUSCULAR | Status: AC
  Filled 2017-08-07: qty 4

## 2017-08-07 MED ORDER — LACTATED RINGERS IV SOLN
INTRAVENOUS | Status: DC | PRN
  Administered 2017-08-07: 40 [IU] via INTRAVENOUS

## 2017-08-07 MED ORDER — PRENATAL MULTIVITAMIN CH
1.0000 | ORAL_TABLET | Freq: Every day | ORAL | Status: DC
  Administered 2017-08-07 – 2017-08-08 (×2): 1 via ORAL
  Filled 2017-08-07 (×2): qty 1

## 2017-08-07 SURGICAL SUPPLY — 33 items
APL SKNCLS STERI-STRIP NONHPOA (GAUZE/BANDAGES/DRESSINGS) ×1
BENZOIN TINCTURE PRP APPL 2/3 (GAUZE/BANDAGES/DRESSINGS) ×3 IMPLANT
CHLORAPREP W/TINT 26ML (MISCELLANEOUS) ×3 IMPLANT
CLAMP CORD UMBIL (MISCELLANEOUS) IMPLANT
CLOSURE WOUND 1/2 X4 (GAUZE/BANDAGES/DRESSINGS) ×1
CLOTH BEACON ORANGE TIMEOUT ST (SAFETY) ×3 IMPLANT
DRSG OPSITE POSTOP 4X10 (GAUZE/BANDAGES/DRESSINGS) ×3 IMPLANT
ELECT REM PT RETURN 9FT ADLT (ELECTROSURGICAL) ×3
ELECTRODE REM PT RTRN 9FT ADLT (ELECTROSURGICAL) ×1 IMPLANT
EXTRACTOR VACUUM BELL STYLE (SUCTIONS) IMPLANT
GLOVE BIO SURGEON STRL SZ7 (GLOVE) ×3 IMPLANT
GLOVE BIOGEL PI IND STRL 7.0 (GLOVE) ×1 IMPLANT
GLOVE BIOGEL PI INDICATOR 7.0 (GLOVE) ×2
GOWN STRL REUS W/TWL LRG LVL3 (GOWN DISPOSABLE) ×6 IMPLANT
KIT ABG SYR 3ML LUER SLIP (SYRINGE) IMPLANT
NDL HYPO 25X5/8 SAFETYGLIDE (NEEDLE) IMPLANT
NEEDLE HYPO 25X5/8 SAFETYGLIDE (NEEDLE) IMPLANT
NS IRRIG 1000ML POUR BTL (IV SOLUTION) ×3 IMPLANT
PACK C SECTION WH (CUSTOM PROCEDURE TRAY) ×3 IMPLANT
PAD OB MATERNITY 4.3X12.25 (PERSONAL CARE ITEMS) ×3 IMPLANT
PENCIL SMOKE EVAC W/HOLSTER (ELECTROSURGICAL) ×3 IMPLANT
RTRCTR C-SECT PINK 25CM LRG (MISCELLANEOUS) ×3 IMPLANT
STRIP CLOSURE SKIN 1/2X4 (GAUZE/BANDAGES/DRESSINGS) ×2 IMPLANT
SUT MNCRL 0 VIOLET CTX 36 (SUTURE) ×2 IMPLANT
SUT MONOCRYL 0 CTX 36 (SUTURE) ×4
SUT PLAIN 2 0 XLH (SUTURE) IMPLANT
SUT VIC AB 0 CT1 27 (SUTURE) ×6
SUT VIC AB 0 CT1 27XBRD ANBCTR (SUTURE) ×2 IMPLANT
SUT VIC AB 2-0 CT1 27 (SUTURE) ×3
SUT VIC AB 2-0 CT1 TAPERPNT 27 (SUTURE) ×1 IMPLANT
SUT VIC AB 4-0 KS 27 (SUTURE) ×3 IMPLANT
TOWEL OR 17X24 6PK STRL BLUE (TOWEL DISPOSABLE) ×3 IMPLANT
TRAY FOLEY BAG SILVER LF 14FR (SET/KITS/TRAYS/PACK) ×3 IMPLANT

## 2017-08-07 NOTE — Progress Notes (Signed)
There has been no change in the patients history, status or exam since the history and physical.  Vitals:   08/07/17 0545  BP: 121/87  Pulse: 88  Resp: 18  Temp: 98.1 F (36.7 C)  TempSrc: Oral  SpO2: 100%    Lab Results  Component Value Date   WBC 11.5 (H) 08/07/2017   HGB 12.9 08/07/2017   HCT 38.9 08/07/2017   MCV 88.4 08/07/2017   PLT 270 08/07/2017    Marilyn Cummings A

## 2017-08-07 NOTE — Brief Op Note (Signed)
08/07/2017  8:40 AM  PATIENT:  Marilyn Cummings  36 y.o. female  PRE-OPERATIVE DIAGNOSIS:  REPEAT  POST-OPERATIVE DIAGNOSIS:  REPEAT  PROCEDURE:  Procedure(s): CESAREAN SECTION (N/A)  SURGEON:  Surgeon(s) and Role:    * Carrington ClampHorvath, Arkel Cartwright, MD - Primary    Marlow Baars* Clark, Dyanna, MD - Assisting  ANESTHESIA:   spinal  EBL:  725 mL   SPECIMEN:  Source of Specimen:  possible bilobed placenta  DISPOSITION OF SPECIMEN:  PATHOLOGY  COUNTS:  YES  TOURNIQUET:  * No tourniquets in log *  DICTATION: .Note written in EPIC  PLAN OF CARE: Admit to inpatient   PATIENT DISPOSITION:  PACU - hemodynamically stable.   Delay start of Pharmacological VTE agent (>24hrs) due to surgical blood loss or risk of bleeding: not applicable  Complications:  none Medications:  Ancef, Pitocin Findings:  Baby female, Apgars 9,9, weight P.   Normal tubes, ovaries and uterus seen.  Baby was skin to skin with mother after birth in the OR.  Technique:  After adequate spinal anesthesia was achieved, the patient was prepped and draped in usual sterile fashion.  A foley catheter was used to drain the bladder.  A pfannanstiel incision was made with the scalpel and carried down to the fascia with the bovie cautery. The fascia was incised in the midline with the scalpel and carried in a transverse curvilinear manner bilaterally.  The fascia was reflected superiorly and inferiorly off the rectus muscles and the muscles split in the midline.  A bowel free portion of the peritoneum was entered bluntly and then extended in a superior and inferior manner with good visualization of the bowel and bladder.  The Alexis instrument was then placed and the vesico-uterine fascia tented up and incised in a transverse curvilinear manner.  A 2 cm transverse incision was made in the upper portion of the lower uterine segment until the amnion was exposed.   The incision was extended transversely in a blunt manner.  Clear fluid was noted and the  baby delivered in the vertex presentation without complication.  The baby was bulb suctioned and the cord was clamped and cut.  The baby was then handed to awaiting Neonatology.  The placenta was then delivered manually and the uterus cleared of all debris.  The uterine incision was then closed with a running lock stitch of 0 monocryl.  An imbricating layer of 0 monocryl was closed as well. Excellent hemostasis of the uterine incision was achieved and the abdomen was cleared with irrigation.  The peritoneum was closed with a running stitch of 2-0 vicryl.  This incorporated the rectus muscles as a separate layer.  The fascia was then closed with a running stitch of 0 vicryl.  The subcutaneous layer was closed with interrupted  stitches of 2-0 plain gut.  The skin was closed with 4-0 vicryl on a Keith needle and steri-strips.  The patient tolerated the procedure well and was returned to the recovery room in stable condition.  All counts were correct times three.  Viviene Thurston A

## 2017-08-07 NOTE — Op Note (Signed)
08/07/2017  8:40 AM  PATIENT:  Marilyn Cummings  36 y.o. female  PRE-OPERATIVE DIAGNOSIS:  REPEAT  POST-OPERATIVE DIAGNOSIS:  REPEAT  PROCEDURE:  Procedure(s): CESAREAN SECTION (N/A)  SURGEON:  Surgeon(s) and Role:    * Aneshia Jacquet, MD - Primary    * Clark, Dyanna, MD - Assisting  ANESTHESIA:   spinal  EBL:  725 mL   SPECIMEN:  Source of Specimen:  possible bilobed placenta  DISPOSITION OF SPECIMEN:  PATHOLOGY  COUNTS:  YES  TOURNIQUET:  * No tourniquets in log *  DICTATION: .Note written in EPIC  PLAN OF CARE: Admit to inpatient   PATIENT DISPOSITION:  PACU - hemodynamically stable.   Delay start of Pharmacological VTE agent (>24hrs) due to surgical blood loss or risk of bleeding: not applicable  Complications:  none Medications:  Ancef, Pitocin Findings:  Baby female, Apgars 9,9, weight P.   Normal tubes, ovaries and uterus seen.  Baby was skin to skin with mother after birth in the OR.  Technique:  After adequate spinal anesthesia was achieved, the patient was prepped and draped in usual sterile fashion.  A foley catheter was used to drain the bladder.  A pfannanstiel incision was made with the scalpel and carried down to the fascia with the bovie cautery. The fascia was incised in the midline with the scalpel and carried in a transverse curvilinear manner bilaterally.  The fascia was reflected superiorly and inferiorly off the rectus muscles and the muscles split in the midline.  A bowel free portion of the peritoneum was entered bluntly and then extended in a superior and inferior manner with good visualization of the bowel and bladder.  The Alexis instrument was then placed and the vesico-uterine fascia tented up and incised in a transverse curvilinear manner.  A 2 cm transverse incision was made in the upper portion of the lower uterine segment until the amnion was exposed.   The incision was extended transversely in a blunt manner.  Clear fluid was noted and the  baby delivered in the vertex presentation without complication.  The baby was bulb suctioned and the cord was clamped and cut.  The baby was then handed to awaiting Neonatology.  The placenta was then delivered manually and the uterus cleared of all debris.  The uterine incision was then closed with a running lock stitch of 0 monocryl.  An imbricating layer of 0 monocryl was closed as well. Excellent hemostasis of the uterine incision was achieved and the abdomen was cleared with irrigation.  The peritoneum was closed with a running stitch of 2-0 vicryl.  This incorporated the rectus muscles as a separate layer.  The fascia was then closed with a running stitch of 0 vicryl.  The subcutaneous layer was closed with interrupted  stitches of 2-0 plain gut.  The skin was closed with 4-0 vicryl on a Keith needle and steri-strips.  The patient tolerated the procedure well and was returned to the recovery room in stable condition.  All counts were correct times three.  Icesis Renn A   

## 2017-08-07 NOTE — Anesthesia Postprocedure Evaluation (Signed)
Anesthesia Post Note  Patient: Marilyn Cummings  Procedure(s) Performed: CESAREAN SECTION (N/A )     Patient location during evaluation: Mother Baby Anesthesia Type: Spinal Level of consciousness: awake and alert and oriented Pain management: pain level controlled Vital Signs Assessment: post-procedure vital signs reviewed and stable Respiratory status: spontaneous breathing and nonlabored ventilation Cardiovascular status: stable Postop Assessment: no headache, patient able to bend at knees, no backache, no apparent nausea or vomiting, spinal receding and adequate PO intake Anesthetic complications: no    Last Vitals:  Vitals:   08/07/17 1330 08/07/17 1430  BP:    Pulse:    Resp:    Temp:    SpO2: 99% 99%    Last Pain:  Vitals:   08/07/17 1230  TempSrc:   PainSc: 4    Pain Goal: Patients Stated Pain Goal: 4 (08/07/17 1230)               Shukri Nistler Hristova

## 2017-08-07 NOTE — Anesthesia Procedure Notes (Addendum)
Spinal  Patient location during procedure: OR Start time: 08/07/2017 7:27 AM End time: 08/07/2017 7:28 AM Staffing Anesthesiologist: Leonides GrillsEllender, Ryan P, MD Resident/CRNA: Cleda ClarksBrowder, Keonia Pasko R, CRNA Performed: other anesthesia staff  Preanesthetic Checklist Completed: patient identified, surgical consent, pre-op evaluation, timeout performed, IV checked, risks and benefits discussed and monitors and equipment checked Spinal Block Patient position: sitting Prep: DuraPrep Patient monitoring: heart rate, blood pressure and continuous pulse ox Approach: midline Location: L4-5 Injection technique: single-shot Needle Needle type: Pencan  Needle gauge: 24 G Needle length: 9 cm Assessment Sensory level: T4

## 2017-08-07 NOTE — Transfer of Care (Signed)
Immediate Anesthesia Transfer of Care Note  Patient: Marilyn Cummings  Procedure(s) Performed: CESAREAN SECTION (N/A )  Patient Location: PACU  Anesthesia Type:Spinal  Level of Consciousness: awake and alert   Airway & Oxygen Therapy: Patient Spontanous Breathing  Post-op Assessment: Report given to RN and Post -op Vital signs reviewed and stable  Post vital signs: Reviewed  Last Vitals:  Vitals:   08/07/17 0545  BP: 121/87  Pulse: 88  Resp: 18  Temp: 36.7 C  SpO2: 100%    Last Pain:  Vitals:   08/07/17 0545  TempSrc: Oral         Complications: No apparent anesthesia complications

## 2017-08-07 NOTE — Anesthesia Preprocedure Evaluation (Addendum)
Anesthesia Evaluation  Patient identified by MRN, date of birth, ID band Patient awake    Reviewed: Allergy & Precautions, NPO status , Patient's Chart, lab work & pertinent test results, reviewed documented beta blocker date and time   Airway Mallampati: II   Neck ROM: Full    Dental  (+) Dental Advisory Given, Teeth Intact   Pulmonary neg pulmonary ROS,    breath sounds clear to auscultation       Cardiovascular negative cardio ROS   Rhythm:Regular     Neuro/Psych Anxiety negative neurological ROS     GI/Hepatic negative GI ROS, Neg liver ROS,   Endo/Other  negative endocrine ROS  Renal/GU negative Renal ROS     Musculoskeletal   Abdominal (+)  Abdomen: soft.    Peds  Hematology   Anesthesia Other Findings   Reproductive/Obstetrics (+) Pregnancy                            Anesthesia Physical  Anesthesia Plan  ASA: II  Anesthesia Plan: Spinal   Post-op Pain Management:    Induction:   PONV Risk Score and Plan:   Airway Management Planned: Natural Airway  Additional Equipment:   Intra-op Plan:   Post-operative Plan:   Informed Consent: I have reviewed the patients History and Physical, chart, labs and discussed the procedure including the risks, benefits and alternatives for the proposed anesthesia with the patient or authorized representative who has indicated his/her understanding and acceptance.     Plan Discussed with: CRNA  Anesthesia Plan Comments:         Anesthesia Quick Evaluation

## 2017-08-08 ENCOUNTER — Encounter (HOSPITAL_COMMUNITY): Payer: Self-pay | Admitting: Obstetrics and Gynecology

## 2017-08-08 LAB — CBC
HEMATOCRIT: 33.5 % — AB (ref 36.0–46.0)
HEMOGLOBIN: 11.1 g/dL — AB (ref 12.0–15.0)
MCH: 29.6 pg (ref 26.0–34.0)
MCHC: 33.1 g/dL (ref 30.0–36.0)
MCV: 89.3 fL (ref 78.0–100.0)
Platelets: 243 10*3/uL (ref 150–400)
RBC: 3.75 MIL/uL — AB (ref 3.87–5.11)
RDW: 13.8 % (ref 11.5–15.5)
WBC: 12 10*3/uL — AB (ref 4.0–10.5)

## 2017-08-09 MED ORDER — OXYCODONE HCL 5 MG PO TABS: 5.0000 mg | ORAL_TABLET | ORAL | 0 refills | Status: DC | PRN

## 2017-08-09 NOTE — Progress Notes (Signed)
POD#2 Pt doing well. She would like to go home. Lochia-wnl. VSSAF IMP/ Stable Plan/ Will discharge to home

## 2017-08-09 NOTE — Discharge Summary (Signed)
Obstetric Discharge Summary Reason for Admission: cesarean section Prenatal Procedures: ultrasound Intrapartum Procedures: cesarean: low cervical, transverse Postpartum Procedures: none Complications-Operative and Postpartum: none Hemoglobin  Date Value Ref Range Status  08/08/2017 11.1 (L) 12.0 - 15.0 g/dL Final   HCT  Date Value Ref Range Status  08/08/2017 33.5 (L) 36.0 - 46.0 % Final    Physical Exam:  General: alert and cooperative Lochia: appropriate Uterine Fundus: firm Incision: healing well DVT Evaluation: No evidence of DVT seen on physical exam.  Discharge Diagnoses: Term Pregnancy-delivered  Discharge Information: Date: 08/09/2017 Activity: pelvic rest Diet: routine Medications: PNV, Ibuprofen and Percocet Condition: stable Instructions: refer to practice specific booklet Discharge to: home Follow-up Information    Carrington ClampHorvath, Michelle, MD. Schedule an appointment as soon as possible for a visit in 1 month(s).   Specialty:  Obstetrics and Gynecology Contact information: [address] [place] [address] [phone]           Newborn Data: Live born female  Birth Weight: 7 lb 9.7 oz (3450 g) APGAR: 9, 9  Newborn Delivery   Birth date/time:  08/07/2017 07:48:00 Delivery type:  C-Section, Low Vertical C-section categorization:  Repeat     Home with mother.  ANDERSON,MARK E 08/09/2017, 8:43 AM

## 2019-09-27 ENCOUNTER — Ambulatory Visit: Payer: BC Managed Care – PPO | Attending: Internal Medicine

## 2019-09-27 DIAGNOSIS — Z20822 Contact with and (suspected) exposure to covid-19: Secondary | ICD-10-CM

## 2019-09-28 LAB — NOVEL CORONAVIRUS, NAA: SARS-CoV-2, NAA: NOT DETECTED

## 2020-08-08 ENCOUNTER — Telehealth: Payer: Self-pay

## 2020-08-08 NOTE — Telephone Encounter (Signed)
Left message for patient to call back for new patient appointment with Dr. Katrinka Blazing.

## 2022-06-13 LAB — HM PAP SMEAR

## 2022-06-13 LAB — HM MAMMOGRAPHY: HM Mammogram: NORMAL (ref 0–4)

## 2022-06-18 ENCOUNTER — Ambulatory Visit: Payer: BC Managed Care – PPO | Admitting: Internal Medicine

## 2022-07-09 NOTE — Progress Notes (Unsigned)
Marilyn Cummings [address] [place] Phone: [phone] Subjective:   Marilyn Cummings, am serving as a scribe for Dr. Hulan Saas.  I'm seeing this patient by the request  of:  No primary care provider on file.  CC: Right shoulder pain  [id number]  Marilyn Cummings is a 41 y.o. female coming in with complaint of right shoulder pain. Pain has been happening over the last 2-3 years. Only really feel it when doing nothing in the evening. More of a dull ache than sharp pain. Location is over deltoid. Taking magnesium for it.       Past Medical History:  Diagnosis Date   Abdominal cramping affecting pregnancy 02/02/2017   Abdominal pain affecting pregnancy 02/02/2017   AMA (advanced maternal age) multigravida 35+    Anxiety    Hx of appendectomy    Hx of seasonal allergies    Medical history non-contributory    Vaginal bleeding in pregnancy 02/02/2017   Past Surgical History:  Procedure Laterality Date   APPENDECTOMY     CESAREAN SECTION N/A 04/10/2015   Procedure: CESAREAN SECTION;  Surgeon: Marilyn Kava, MD;  Location: [facility];  Service: Obstetrics;  Laterality: N/A;   CESAREAN SECTION N/A 08/07/2017   Procedure: CESAREAN SECTION;  Surgeon: Marilyn Charleston, MD;  Location: [facility];  Service: Obstetrics;  Laterality: N/A;   Social History   Socioeconomic History   Marital status: Married    Spouse name: Not on file   Number of children: Not on file   Years of education: Not on file   Highest education level: Not on file  Occupational History   Not on file  Tobacco Use   Smoking status: Never   Smokeless tobacco: Never  Substance and Sexual Activity   Alcohol use: No   Drug use: No   Sexual activity: Yes  Other Topics Concern   Not on file  Social History Narrative   Not on file   Social Determinants of Health   Financial Resource Strain: Not on file  Food Insecurity: Not on file  Transportation Needs:  Not on file  Physical Activity: Not on file  Stress: Not on file  Social Connections: Not on file   No Known Allergies Family History  Problem Relation Age of Onset   Diabetes Maternal Grandmother    Diabetes Maternal Grandfather       Current Outpatient Medications (Respiratory):    cetirizine (ZYRTEC) 10 MG tablet, Take 10 mg by mouth at bedtime.  Current Outpatient Medications (Analgesics):    oxyCODONE (OXY IR/ROXICODONE) 5 MG immediate release tablet, Take 1 tablet (5 mg total) every 4 (four) hours as needed by mouth (pain scale 4-7).   Current Outpatient Medications (Other):    Vitamin D, Ergocalciferol, (DRISDOL) 1.25 MG (50000 UNIT) CAPS capsule, Take 1 capsule (50,000 Units total) by mouth every 7 (seven) days.   docusate sodium (COLACE) 100 MG capsule, Take 200 mg daily as needed by mouth for mild constipation.    Prenatal Vit-Fe Fumarate-FA (PRENATAL MULTIVITAMIN) TABS tablet, Take 3 tablets by mouth daily.    Reviewed prior external information including notes and imaging from  primary care provider As well as notes that were available from care everywhere and other healthcare systems.  Past medical history, social, surgical and family history all reviewed in electronic medical record.  No pertanent information unless stated regarding to the chief complaint.   Review of Systems:  No headache, visual changes,  nausea, vomiting, diarrhea, constipation, dizziness, abdominal pain, skin rash, fevers, chills, night sweats, weight loss, swollen lymph nodes, body aches, joint swelling, chest pain, shortness of breath, mood changes. POSITIVE muscle aches  Objective  Blood pressure 122/86, pulse 76, height 5\' 3"  (1.6 m), weight 133 lb (60.3 kg), SpO2 99 %, unknown if currently breastfeeding.   General: No apparent distress alert and oriented x3 mood and affect normal, dressed appropriately.  HEENT: Pupils equal, extraocular movements intact  Respiratory: Patient's speak in  full sentences and does not appear short of breath  Cardiovascular: No lower extremity edema, non tender, no erythema  Right shoulder exam does have positive impingement noted.  Positive O'Brien's maneuver.  Rotator cuff strength does appear to be intact though in internal and external range of motion.  Negative empty can test.  Limited muscular skeletal ultrasound was performed and interpreted by , M  Limited ultrasound shows rotator cuff appears to be intact with some very mild calcific spurring noted that seems to be off the humeral head more than the tendon itself.  Seems to be more of the posterior aspect of the supraspinatus.  Patient has a cortical irregularity noted of the humerus is well noted.  [id number]; 15 additional minutes spent for Therapeutic exercises as stated in above notes.  This included exercises focusing on stretching, strengthening, with significant focus on eccentric aspects.   Long term goals include an improvement in range of motion, strength, endurance as well as avoiding reinjury. Patient's frequency would include in 1-2 times a day, 3-5 times a week for a duration of 6-12 weeks. Shoulder Exercises that included:  Basic scapular stabilization to include adduction and depression of scapula Scaption, focusing on proper movement and good control Internal and External rotation utilizing a theraband, with elbow tucked at side entire time Rows with theraband   Proper technique shown and discussed handout in great detail with ATC.  All questions were discussed and answered.      Impression and Recommendations:

## 2022-07-11 ENCOUNTER — Encounter: Payer: Self-pay | Admitting: Family Medicine

## 2022-07-11 ENCOUNTER — Ambulatory Visit: Payer: 59 | Admitting: Family Medicine

## 2022-07-11 ENCOUNTER — Ambulatory Visit (INDEPENDENT_AMBULATORY_CARE_PROVIDER_SITE_OTHER): Payer: 59

## 2022-07-11 ENCOUNTER — Ambulatory Visit: Payer: Self-pay

## 2022-07-11 VITALS — BP 122/86 | HR 76 | Ht 63.0 in | Wt 133.0 lb

## 2022-07-11 DIAGNOSIS — M25511 Pain in right shoulder: Secondary | ICD-10-CM | POA: Diagnosis not present

## 2022-07-11 DIAGNOSIS — M7531 Calcific tendinitis of right shoulder: Secondary | ICD-10-CM | POA: Diagnosis not present

## 2022-07-11 MED ORDER — VITAMIN D (ERGOCALCIFEROL) 1.25 MG (50000 UNIT) PO CAPS: 50000.0000 [IU] | ORAL_CAPSULE | ORAL | 0 refills | Status: AC

## 2022-07-11 NOTE — Assessment & Plan Note (Signed)
Patient does have what appears to be more of a calcific tendinitis.  I think there is a possibility for cortical irregularity of the femoral head.  Discussed with patient about icing regimen, home exercises, which activities to do and which ones to avoid.  Follow-up again after doing home exercises, vitamin D supplementation follow-up with me in 6 to 8 weeks worsening pain consider formal physical therapy or consider possible injection depending on further evaluation.

## 2022-07-11 NOTE — Patient Instructions (Signed)
Xray right shoulder today Do prescribed exercises at least 3x a week Once weekly Vit D prescribed Hands in peripheral vision

## 2022-07-24 ENCOUNTER — Encounter: Payer: Self-pay | Admitting: Internal Medicine

## 2022-07-24 ENCOUNTER — Ambulatory Visit (INDEPENDENT_AMBULATORY_CARE_PROVIDER_SITE_OTHER): Payer: 59 | Admitting: Internal Medicine

## 2022-07-24 VITALS — BP 144/96 | HR 78 | Temp 98.2°F | Resp 16 | Ht 63.0 in | Wt 136.0 lb

## 2022-07-24 DIAGNOSIS — I10 Essential (primary) hypertension: Secondary | ICD-10-CM | POA: Diagnosis not present

## 2022-07-24 DIAGNOSIS — Z0001 Encounter for general adult medical examination with abnormal findings: Secondary | ICD-10-CM | POA: Insufficient documentation

## 2022-07-24 DIAGNOSIS — I119 Hypertensive heart disease without heart failure: Secondary | ICD-10-CM | POA: Insufficient documentation

## 2022-07-24 DIAGNOSIS — Z1159 Encounter for screening for other viral diseases: Secondary | ICD-10-CM | POA: Insufficient documentation

## 2022-07-24 NOTE — Progress Notes (Signed)
Subjective:  Patient ID: Marilyn Cummings, female    DOB: [birth year]  Age: 41 y.o. MRN: [id number]  CC: Annual Exam and Hypertension   HPI Marilyn Cummings presents for a CPX and to establish -  She is active an denies DOE, CP, SOB, edema, diaphoresis.  History Marilyn Cummings has a past medical history of Abdominal cramping affecting pregnancy (02/02/2017), Abdominal pain affecting pregnancy (02/02/2017), AMA (advanced maternal age) multigravida 35+, Anxiety, appendectomy, seasonal allergies, Medical history non-contributory, Migraine, and Vaginal bleeding in pregnancy (02/02/2017).   She has a past surgical history that includes Appendectomy; Cesarean section (N/A, 04/10/2015); and Cesarean section (N/A, 08/07/2017).   Her family history includes Diabetes in her maternal grandfather and maternal grandmother; Hypertension in her father, mother, and sister.She reports that she has never smoked. She has never used smokeless tobacco. She reports that she does not drink alcohol and does not use drugs.  Outpatient Medications Prior to Visit  Medication Sig Dispense Refill   cetirizine (ZYRTEC) 10 MG tablet Take 10 mg by mouth at bedtime.     Prenatal Vit-Fe Fumarate-FA (PRENATAL MULTIVITAMIN) TABS tablet Take 3 tablets by mouth daily.      Vitamin D, Ergocalciferol, (DRISDOL) 1.25 MG (50000 UNIT) CAPS capsule Take 1 capsule (50,000 Units total) by mouth every 7 (seven) days. 12 capsule 0   docusate sodium (COLACE) 100 MG capsule Take 200 mg daily as needed by mouth for mild constipation.      oxyCODONE (OXY IR/ROXICODONE) 5 MG immediate release tablet Take 1 tablet (5 mg total) every 4 (four) hours as needed by mouth (pain scale 4-7). 20 tablet 0   No facility-administered medications prior to visit.    ROS Review of Systems  Constitutional:  Negative for diaphoresis and fatigue.  HENT: Negative.    Eyes:  Negative for photophobia and visual disturbance.  Respiratory: Negative.  Negative for  apnea, cough, chest tightness, shortness of breath and wheezing.   Cardiovascular:  Negative for chest pain, palpitations and leg swelling.  Gastrointestinal:  Negative for abdominal pain, constipation, diarrhea, nausea and vomiting.  Endocrine: Negative.   Genitourinary:  Negative for difficulty urinating, dysuria and hematuria.  Musculoskeletal: Negative.  Negative for arthralgias.  Skin: Negative.   Allergic/Immunologic: Negative.   Neurological: Negative.  Negative for dizziness, weakness, light-headedness and headaches.  Hematological:  Negative for adenopathy. Does not bruise/bleed easily.  Psychiatric/Behavioral: Negative.      Objective:  BP (!) 144/96 (BP Location: Right Arm, Patient Position: Sitting, Cuff Size: Large)   Pulse 78   Temp 98.2 F (36.8 C) (Oral)   Resp 16   Ht 5\' 3"  (1.6 m)   Wt 136 lb (61.7 kg)   LMP 07/05/2022 (Approximate)   SpO2 98%   BMI 24.09 kg/m   Physical Exam Vitals reviewed.  HENT:     Nose: Nose normal.     Mouth/Throat:     Mouth: Mucous membranes are moist.  Eyes:     General: No scleral icterus.    Conjunctiva/sclera: Conjunctivae normal.  Cardiovascular:     Rate and Rhythm: Normal rate and regular rhythm.     Heart sounds: Normal heart sounds, S1 normal and S2 normal. No murmur heard.    Comments: EKG- NSR, 66 bpm LVH with reciprocal changes in V1 V2 No Q waves Pulmonary:     Effort: Pulmonary effort is normal.     Breath sounds: No stridor. No wheezing, rhonchi or rales.  Abdominal:     General: Abdomen is  flat.     Palpations: There is no mass.     Tenderness: There is no abdominal tenderness. There is no guarding.     Hernia: No hernia is present.  Musculoskeletal:     Cervical back: Neck supple.     Right lower leg: No edema.     Left lower leg: No edema.  Lymphadenopathy:     Cervical: No cervical adenopathy.  Skin:    General: Skin is warm and dry.  Neurological:     General: No focal deficit present.      Mental Status: She is alert.  Psychiatric:        Mood and Affect: Mood normal.        Behavior: Behavior normal.     Lab Results  Component Value Date   WBC 11.3 (H) 07/24/2022   HGB 13.5 07/24/2022   HCT 40.9 07/24/2022   PLT 346.0 07/24/2022   GLUCOSE 83 07/24/2022   CHOL 183 07/24/2022   TRIG 91.0 07/24/2022   HDL 56.80 07/24/2022   LDLCALC 108 (H) 07/24/2022   ALT 10 07/24/2022   AST 19 07/24/2022   NA 138 07/24/2022   K 3.9 07/24/2022   CL 100 07/24/2022   CREATININE 0.72 07/24/2022   BUN 19 07/24/2022   CO2 28 07/24/2022   TSH 1.63 07/24/2022     Assessment & Plan:   Marilyn Cummings was seen today for annual exam and hypertension.  Diagnoses and all orders for this visit:  Encounter for general adult medical examination with abnormal findings- Exam completed, labs reviewed - statin is not indicated, she refused a flu vax, cancer screenings are UTD, pt ed was given. -     Lipid panel; Future -     Hepatitis C antibody; Future -     Lipid panel -     Hepatitis C antibody  Primary hypertension- Will evaluate for end organ damage and secondary causes. Will treat with an ARB. -     Basic metabolic panel; Future -     Aldosterone + renin activity w/ ratio; Future -     TSH; Future -     Urinalysis, Routine w reflex microscopic; Future -     Hepatic function panel; Future -     CBC with Differential/Platelet; Future -     EKG 12-Lead -     Basic metabolic panel -     Aldosterone + renin activity w/ ratio -     TSH -     Urinalysis, Routine w reflex microscopic -     Hepatic function panel -     CBC with Differential/Platelet -     olmesartan (BENICAR) 20 MG tablet; Take 1 tablet (20 mg total) by mouth daily.  Need for hepatitis C screening test -     Hepatitis C antibody; Future -     Hepatitis C antibody  LVH (left ventricular hypertrophy) due to hypertensive disease, without heart failure- Will start an ARB. -     olmesartan (BENICAR) 20 MG tablet; Take 1  tablet (20 mg total) by mouth daily.   I have discontinued Marilyn Cummings's docusate sodium and oxyCODONE. I am also having her start on olmesartan. Additionally, I am having her maintain her prenatal multivitamin, cetirizine, and Vitamin D (Ergocalciferol).  Meds ordered this encounter  Medications   olmesartan (BENICAR) 20 MG tablet    Sig: Take 1 tablet (20 mg total) by mouth daily.    Dispense:  90 tablet  Refill:  0     Follow-up: Return in about 3 months (around 10/24/2022).  Sanda Linger, MD

## 2022-07-24 NOTE — Patient Instructions (Signed)
Hypertension, Adult High blood pressure (hypertension) is when the force of blood pumping through the arteries is too strong. The arteries are the blood vessels that carry blood from the heart throughout the body. Hypertension forces the heart to work harder to pump blood and may cause arteries to become narrow or stiff. Untreated or uncontrolled hypertension can lead to a heart attack, heart failure, a stroke, kidney disease, and other problems. A blood pressure reading consists of a higher number over a lower number. Ideally, your blood pressure should be below 120/80. The first ("top") number is called the systolic pressure. It is a measure of the pressure in your arteries as your heart beats. The second ("bottom") number is called the diastolic pressure. It is a measure of the pressure in your arteries as the heart relaxes. What are the causes? The exact cause of this condition is not known. There are some conditions that result in high blood pressure. What increases the risk? Certain factors may make you more likely to develop high blood pressure. Some of these risk factors are under your control, including: Smoking. Not getting enough exercise or physical activity. Being overweight. Having too much fat, sugar, calories, or salt (sodium) in your diet. Drinking too much alcohol. Other risk factors include: Having a personal history of heart disease, diabetes, high cholesterol, or kidney disease. Stress. Having a family history of high blood pressure and high cholesterol. Having obstructive sleep apnea. Age. The risk increases with age. What are the signs or symptoms? High blood pressure may not cause symptoms. Very high blood pressure (hypertensive crisis) may cause: Headache. Fast or irregular heartbeats (palpitations). Shortness of breath. Nosebleed. Nausea and vomiting. Vision changes. Severe chest pain, dizziness, and seizures. How is this diagnosed? This condition is diagnosed by  measuring your blood pressure while you are seated, with your arm resting on a flat surface, your legs uncrossed, and your feet flat on the floor. The cuff of the blood pressure monitor will be placed directly against the skin of your upper arm at the level of your heart. Blood pressure should be measured at least twice using the same arm. Certain conditions can cause a difference in blood pressure between your right and left arms. If you have a high blood pressure reading during one visit or you have normal blood pressure with other risk factors, you may be asked to: Return on a different day to have your blood pressure checked again. Monitor your blood pressure at home for 1 week or longer. If you are diagnosed with hypertension, you may have other blood or imaging tests to help your health care provider understand your overall risk for other conditions. How is this treated? This condition is treated by making healthy lifestyle changes, such as eating healthy foods, exercising more, and reducing your alcohol intake. You may be referred for counseling on a healthy diet and physical activity. Your health care provider may prescribe medicine if lifestyle changes are not enough to get your blood pressure under control and if: Your systolic blood pressure is above 130. Your diastolic blood pressure is above 80. Your personal target blood pressure may vary depending on your medical conditions, your age, and other factors. Follow these instructions at home: Eating and drinking  Eat a diet that is high in fiber and potassium, and low in sodium, added sugar, and fat. An example of this eating plan is called the DASH diet. DASH stands for Dietary Approaches to Stop Hypertension. To eat this way: Eat   plenty of fresh fruits and vegetables. Try to fill one half of your plate at each meal with fruits and vegetables. Eat whole grains, such as whole-wheat pasta, brown rice, or whole-grain bread. Fill about one  fourth of your plate with whole grains. Eat or drink low-fat dairy products, such as skim milk or low-fat yogurt. Avoid fatty cuts of meat, processed or cured meats, and poultry with skin. Fill about one fourth of your plate with lean proteins, such as fish, chicken without skin, beans, eggs, or tofu. Avoid pre-made and processed foods. These tend to be higher in sodium, added sugar, and fat. Reduce your daily sodium intake. Many people with hypertension should eat less than 1,500 mg of sodium a day. Do not drink alcohol if: Your health care provider tells you not to drink. You are pregnant, may be pregnant, or are planning to become pregnant. If you drink alcohol: Limit how much you have to: 0-1 drink a day for women. 0-2 drinks a day for men. Know how much alcohol is in your drink. In the [place] one drink equals one 12 oz bottle of beer (355 mL), one 5 oz glass of wine (148 mL), or one 1 oz glass of hard liquor (44 mL). Lifestyle  Work with your health care provider to maintain a healthy body weight or to lose weight. Ask what an ideal weight is for you. Get at least 30 minutes of exercise that causes your heart to beat faster (aerobic exercise) most days of the week. Activities may include walking, swimming, or biking. Include exercise to strengthen your muscles (resistance exercise), such as Pilates or lifting weights, as part of your weekly exercise routine. Try to do these types of exercises for 30 minutes at least 3 days a week. Do not use any products that contain nicotine or tobacco. These products include cigarettes, chewing tobacco, and vaping devices, such as e-cigarettes. If you need help quitting, ask your health care provider. Monitor your blood pressure at home as told by your health care provider. Keep all follow-up visits. This is important. Medicines Take over-the-counter and prescription medicines only as told by your health care provider. Follow directions carefully. Blood  pressure medicines must be taken as prescribed. Do not skip doses of blood pressure medicine. Doing this puts you at risk for problems and can make the medicine less effective. Ask your health care provider about side effects or reactions to medicines that you should watch for. Contact a health care provider if you: Think you are having a reaction to a medicine you are taking. Have headaches that keep coming back (recurring). Feel dizzy. Have swelling in your ankles. Have trouble with your vision. Get help right away if you: Develop a severe headache or confusion. Have unusual weakness or numbness. Feel faint. Have severe pain in your chest or abdomen. Vomit repeatedly. Have trouble breathing. These symptoms may be an emergency. Get help right away. Call 911. Do not wait to see if the symptoms will go away. Do not drive yourself to the hospital. Summary Hypertension is when the force of blood pumping through your arteries is too strong. If this condition is not controlled, it may put you at risk for serious complications. Your personal target blood pressure may vary depending on your medical conditions, your age, and other factors. For most people, a normal blood pressure is less than 120/80. Hypertension is treated with lifestyle changes, medicines, or a combination of both. Lifestyle changes include losing weight, eating a healthy,   low-sodium diet, exercising more, and limiting alcohol. This information is not intended to replace advice given to you by your health care provider. Make sure you discuss any questions you have with your health care provider. Document Revised: 07/17/2021 Document Reviewed: 07/17/2021 Elsevier Patient Education  2023 Elsevier Inc.  

## 2022-07-25 ENCOUNTER — Encounter: Payer: Self-pay | Admitting: Internal Medicine

## 2022-07-25 LAB — CBC WITH DIFFERENTIAL/PLATELET
Basophils Absolute: 0.1 10*3/uL (ref 0.0–0.1)
Basophils Relative: 1 % (ref 0.0–3.0)
Eosinophils Absolute: 0.2 10*3/uL (ref 0.0–0.7)
Eosinophils Relative: 1.5 % (ref 0.0–5.0)
HCT: 40.9 % (ref 36.0–46.0)
Hemoglobin: 13.5 g/dL (ref 12.0–15.0)
Lymphocytes Relative: 26.1 % (ref 12.0–46.0)
Lymphs Abs: 3 10*3/uL (ref 0.7–4.0)
MCHC: 33 g/dL (ref 30.0–36.0)
MCV: 86.2 fl (ref 78.0–100.0)
Monocytes Absolute: 1 10*3/uL (ref 0.1–1.0)
Monocytes Relative: 8.5 % (ref 3.0–12.0)
Neutro Abs: 7.1 10*3/uL (ref 1.4–7.7)
Neutrophils Relative %: 62.9 % (ref 43.0–77.0)
Platelets: 346 10*3/uL (ref 150.0–400.0)
RBC: 4.74 Mil/uL (ref 3.87–5.11)
RDW: 13.1 % (ref 11.5–15.5)
WBC: 11.3 10*3/uL — ABNORMAL HIGH (ref 4.0–10.5)

## 2022-07-25 LAB — URINALYSIS, ROUTINE W REFLEX MICROSCOPIC
Leukocytes,Ua: NEGATIVE
Nitrite: NEGATIVE
Specific Gravity, Urine: 1.025 (ref 1.000–1.030)
Total Protein, Urine: NEGATIVE
Urine Glucose: NEGATIVE
Urobilinogen, UA: 0.2 (ref 0.0–1.0)
pH: 6 (ref 5.0–8.0)

## 2022-07-25 LAB — BASIC METABOLIC PANEL
BUN: 19 mg/dL (ref 6–23)
CO2: 28 mEq/L (ref 19–32)
Calcium: 9.8 mg/dL (ref 8.4–10.5)
Chloride: 100 mEq/L (ref 96–112)
Creatinine, Ser: 0.72 mg/dL (ref 0.40–1.20)
GFR: 104.59 mL/min (ref >60.00)
Glucose, Bld: 83 mg/dL (ref 70–99)
Potassium: 3.9 mEq/L (ref 3.5–5.1)
Sodium: 138 mEq/L (ref 135–145)

## 2022-07-25 LAB — LIPID PANEL
Cholesterol: 183 mg/dL (ref 0–200)
HDL: 56.8 mg/dL (ref >39.00)
LDL Cholesterol: 108 mg/dL — ABNORMAL HIGH (ref 0–99)
NonHDL: 126.51
Total CHOL/HDL Ratio: 3
Triglycerides: 91 mg/dL (ref 0.0–149.0)
VLDL: 18.2 mg/dL (ref 0.0–40.0)

## 2022-07-25 LAB — TSH: TSH: 1.63 u[IU]/mL (ref 0.35–5.50)

## 2022-07-25 LAB — HEPATIC FUNCTION PANEL
ALT: 10 U/L (ref 0–35)
AST: 19 U/L (ref 0–37)
Albumin: 4.7 g/dL (ref 3.5–5.2)
Alkaline Phosphatase: 51 U/L (ref 39–117)
Bilirubin, Direct: 0 mg/dL (ref 0.0–0.3)
Total Bilirubin: 0.3 mg/dL (ref 0.2–1.2)
Total Protein: 8.1 g/dL (ref 6.0–8.3)

## 2022-07-25 LAB — HEPATITIS C ANTIBODY: Hepatitis C Ab: NONREACTIVE

## 2022-07-25 MED ORDER — OLMESARTAN MEDOXOMIL 20 MG PO TABS: 20.0000 mg | ORAL_TABLET | Freq: Every day | ORAL | 0 refills | Status: DC

## 2022-08-01 LAB — ALDOSTERONE + RENIN ACTIVITY W/ RATIO
ALDO / PRA Ratio: 0.6 Ratio — ABNORMAL LOW (ref 0.9–28.9)
Aldosterone: 3 ng/dL
Renin Activity: 5.06 ng/mL/h (ref 0.25–5.82)

## 2022-08-03 NOTE — Addendum Note (Signed)
Addended by: Etta Grandchild on: 08/03/2022 10:25 AM   Modules accepted: Orders

## 2022-08-27 NOTE — Progress Notes (Unsigned)
[name] [address] [place] [address] Phone: [phone] Subjective:    I'm seeing this patient by the request  of:  Marilyn Grandchild, MD  CC: Right shoulder pain follow-up  ACZ:[id number]  07/11/2022 Patient does have what appears to be more of a calcific tendinitis.  I think there is a possibility for cortical irregularity of the femoral head.  Discussed with patient about icing regimen, home exercises, which activities to do and which ones to avoid.  Follow-up again after doing home exercises, vitamin D supplementation follow-up with me in 6 to 8 weeks worsening pain consider formal physical therapy or consider possible injection depending on further evaluation.     Update 08/29/2022 Marilyn Cummings is a 41 y.o. female coming in with complaint of R shoulder pain. Patient states that her shoulder is doing better. Picked up her son on Thanksgiving and had sharp pain later that night. Pain over middle deltoid.   Xray R shoulder 07/11/2022 IMPRESSION: An 11 mm amorphous calcification just above the humeral head in the region of the rotator cuff insertion, suggesting calcific tendinopathy.      Past Medical History:  Diagnosis Date   Abdominal cramping affecting pregnancy 02/02/2017   Abdominal pain affecting pregnancy 02/02/2017   AMA (advanced maternal age) multigravida 35+    Anxiety    Hx of appendectomy    Hx of seasonal allergies    Medical history non-contributory    Migraine    Vaginal bleeding in pregnancy 02/02/2017   Past Surgical History:  Procedure Laterality Date   APPENDECTOMY     CESAREAN SECTION N/A 04/10/2015   Procedure: CESAREAN SECTION;  Surgeon: Essie Hart, MD;  Location: [facility];  Service: Obstetrics;  Laterality: N/A;   CESAREAN SECTION N/A 08/07/2017   Procedure: CESAREAN SECTION;  Surgeon: Carrington Clamp, MD;  Location: [facility]TH[facility] SU[facility];  Service: Obstetrics;  Laterality: N/A;   Social History    Socioeconomic History   Marital status: Married    Spouse name: Not on file   Number of children: Not on file   Years of education: Not on file   Highest education level: Not on file  Occupational History   Not on file  Tobacco Use   Smoking status: Never   Smokeless tobacco: Never  Substance and Sexual Activity   Alcohol use: No   Drug use: No   Sexual activity: Yes    Partners: Male    Birth control/protection: Other-see comments    Comment: H has had a vasectomy  Other Topics Concern   Not on file  Social History Narrative   Not on file   Social Determinants of Health   Financial Resource Strain: Not on file  Food Insecurity: Not on file  Transportation Needs: Not on file  Physical Activity: Not on file  Stress: Not on file  Social Connections: Not on file   No Known Allergies Family History  Problem Relation Age of Onset   Hypertension Mother    Hypertension Father    Hypertension Sister    Diabetes Maternal Grandmother    Diabetes Maternal Grandfather      Current Outpatient Medications (Cardiovascular):    olmesartan (BENICAR) 20 MG tablet, Take 1 tablet (20 mg total) by mouth daily.  Current Outpatient Medications (Respiratory):    cetirizine (ZYRTEC) 10 MG tablet, Take 10 mg by mouth at bedtime.  Current Outpatient Medications (Analgesics):    meloxicam (MOBIC) 15 MG tablet, Take 1 tablet (15  mg total) by mouth daily.   Current Outpatient Medications (Other):    Vitamin D, Ergocalciferol, (DRISDOL) 1.25 MG (50000 UNIT) CAPS capsule, Take 1 capsule (50,000 Units total) by mouth every 7 (seven) days.   Reviewed prior external information including notes and imaging from  primary care provider As well as notes that were available from care everywhere and other healthcare systems.  Past medical history, social, surgical and family history all reviewed in electronic medical record.  No pertanent information unless stated regarding to the chief  complaint.   Review of Systems:  No headache, visual changes, nausea, vomiting, diarrhea, constipation, dizziness, abdominal pain, skin rash, fevers, chills, night sweats, weight loss, swollen lymph nodes, body aches, joint swelling, chest pain, shortness of breath, mood changes. POSITIVE muscle aches  Objective  Blood pressure 108/80, pulse 76, height 5\' 3"  (1.6 m), weight 134 lb (60.8 kg), SpO2 99 %, unknown if currently breastfeeding.   General: No apparent distress alert and oriented x3 mood and affect normal, dressed appropriately.  HEENT: Pupils equal, extraocular movements intact  Respiratory: Patient's speak in full sentences and does not appear short of breath  Cardiovascular: No lower extremity edema, non tender, no erythema  Right shoulder exam has great range of motion noted.  Very mild positive impingement noted. 5 out of 5 strength of the upper extremity.  Limited muscular skeletal ultrasound was performed and interpreted by , M  Limited ultrasound of patient's shoulder still shows some calcific changes noted of the supraspinatus.  Significantly less than previous exam.  Some mild hypoechoic changes that does appear to be more like reactive bursitis. Impression: Improvement in the calcific changes noted previously   Impression and Recommendations:    The above documentation has been reviewed and is accurate and complete Marilyn Primas, DO

## 2022-08-29 ENCOUNTER — Ambulatory Visit: Payer: 59 | Admitting: Family Medicine

## 2022-08-29 ENCOUNTER — Ambulatory Visit: Payer: Self-pay

## 2022-08-29 ENCOUNTER — Encounter: Payer: Self-pay | Admitting: Family Medicine

## 2022-08-29 VITALS — BP 108/80 | HR 76 | Ht 63.0 in | Wt 134.0 lb

## 2022-08-29 DIAGNOSIS — M7531 Calcific tendinitis of right shoulder: Secondary | ICD-10-CM | POA: Diagnosis not present

## 2022-08-29 DIAGNOSIS — M25511 Pain in right shoulder: Secondary | ICD-10-CM | POA: Diagnosis not present

## 2022-08-29 MED ORDER — MELOXICAM 15 MG PO TABS: 15.0000 mg | ORAL_TABLET | Freq: Every day | ORAL | 0 refills | Status: DC

## 2022-08-29 NOTE — Patient Instructions (Signed)
Looking much better Finished Vit D Melxoicam 15mg  daily for 10 days then as needed See me in 6-8 weeks if not perfect

## 2022-08-29 NOTE — Assessment & Plan Note (Addendum)
Calcific changes is significantly improved at this time but continues to have some discomfort and pain.  Discussed icing regimen and home exercises, discussed which activities to do and which ones to avoid.  Increase activity slowly.  Follow-up again in 6 to 8 weeks discussed meloxicam as well.

## 2022-10-17 ENCOUNTER — Ambulatory Visit: Payer: 59 | Admitting: Family Medicine

## 2023-01-01 ENCOUNTER — Telehealth: Payer: Self-pay | Admitting: Internal Medicine

## 2023-01-01 NOTE — Telephone Encounter (Signed)
Patient is requesting to transfer care from Dr. Sanda Linger to Dr. Asencion Partridge since location is closer to her. Is this okay?

## 2024-03-15 ENCOUNTER — Encounter (HOSPITAL_COMMUNITY): Payer: Self-pay | Admitting: Emergency Medicine

## 2024-03-15 ENCOUNTER — Other Ambulatory Visit: Payer: Self-pay

## 2024-03-15 ENCOUNTER — Ambulatory Visit (HOSPITAL_COMMUNITY)
Admission: EM | Admit: 2024-03-15 | Discharge: 2024-03-15 | Disposition: A | Discharge status: Home / Self Care | Payer: Self-pay | Attending: Emergency Medicine | Admitting: Emergency Medicine

## 2024-03-15 DIAGNOSIS — R200 Anesthesia of skin: Secondary | ICD-10-CM

## 2024-03-15 DIAGNOSIS — R42 Dizziness and giddiness: Secondary | ICD-10-CM

## 2024-03-15 DIAGNOSIS — R079 Chest pain, unspecified: Secondary | ICD-10-CM

## 2024-03-15 NOTE — ED Provider Notes (Signed)
 [facility]    CSN: [id number] Arrival date & time: 03/15/24  1416     History   Chief Complaint Chief Complaint  Patient presents with   Chest Pain   arm numbness    HPI Marilyn Cummings is a 43 y.o. female.  Woke from sleep this morning at 2 am feeling dizziness and tight sensation in the substernal chest. Then developed numbness in the left hand, starting to extend up the arm. Felt her heart was beating very quickly. Symptoms seemed worse when she was laying flat. She denies upper chest pain or pressure Not short of breath  History of LVH, HTN  Past Medical History:  Diagnosis Date   Abdominal cramping affecting pregnancy 02/02/2017   Abdominal pain affecting pregnancy 02/02/2017   AMA (advanced maternal age) multigravida 35+    Anxiety    Hx of appendectomy    Hx of seasonal allergies    Medical history non-contributory    Migraine    Vaginal bleeding in pregnancy 02/02/2017    Patient Active Problem List   Diagnosis Date Noted   Encounter for general adult medical examination with abnormal findings 07/24/2022   Primary hypertension 07/24/2022   Need for hepatitis C screening test 07/24/2022   LVH (left ventricular hypertrophy) due to hypertensive disease, without heart failure 07/24/2022   Calcific tendinitis of right shoulder 07/11/2022   Postoperative state 08/07/2017    Past Surgical History:  Procedure Laterality Date   APPENDECTOMY     CESAREAN SECTION N/A 04/10/2015   Procedure: CESAREAN SECTION;  Surgeon: Gigi Botts, MD;  Location: [facility];  Service: Obstetrics;  Laterality: N/A;   CESAREAN SECTION N/A 08/07/2017   Procedure: CESAREAN SECTION;  Surgeon: Sarrah Browning, MD;  Location: [facility]TH[facility] SU[facility];  Service: Obstetrics;  Laterality: N/A;    OB History     Gravida  3   Para  2   Term  1   Preterm  1   AB  1   Living  2      SAB      IAB  1   Ectopic      Multiple  0   Live Births  2            Home  Medications    Prior to Admission medications   Medication Sig Start Date End Date Taking? Authorizing Provider  cetirizine (ZYRTEC) 10 MG tablet Take 10 mg by mouth at bedtime.    [provider]  meloxicam  (MOBIC ) 15 MG tablet Take 1 tablet (15 mg total) by mouth daily. 08/29/22   Claudene Arthea HERO, DO  olmesartan  (BENICAR ) 20 MG tablet Take 1 tablet (20 mg total) by mouth daily. 07/25/22   Joshua Debby CROME, MD  Vitamin D , Ergocalciferol , (DRISDOL ) 1.25 MG (50000 UNIT) CAPS capsule Take 1 capsule (50,000 Units total) by mouth every 7 (seven) days. 07/11/22   Claudene Arthea HERO, DO    Family History Family History  Problem Relation Age of Onset   Hypertension Mother    Hypertension Father    Hypertension Sister    Diabetes Maternal Grandmother    Diabetes Maternal Grandfather     Social History Social History   Tobacco Use   Smoking status: Never   Smokeless tobacco: Never  Vaping Use   Vaping status: Never Used  Substance Use Topics   Alcohol use: No   Drug use: No     Allergies   Patient has no known allergies.  Review of Systems Review of Systems As per HPI  Physical Exam Triage Vital Signs ED Triage Vitals  Encounter Vitals Group     BP 03/15/24 1428 (!) 140/77     Girls Systolic BP Percentile --      Girls Diastolic BP Percentile --      Boys Systolic BP Percentile --      Boys Diastolic BP Percentile --      Pulse Rate 03/15/24 1428 93     Resp 03/15/24 1428 18     Temp --      Temp src --      SpO2 03/15/24 1428 100 %     Weight --      Height --      Head Circumference --      Peak Flow --      Pain Score 03/15/24 1424 5     Pain Loc --      Pain Education --      Exclude from Growth Chart --    No data found.  Updated Vital Signs BP (!) 140/77 (BP Location: Right Arm)   Pulse 93   Resp 18   LMP 02/23/2024 (Approximate)   SpO2 100%    Physical Exam Vitals and nursing note reviewed.  Constitutional:      General: She is not in  acute distress. HENT:     Head: Atraumatic.     Nose: Nose normal.     Mouth/Throat:     Mouth: Mucous membranes are moist.     Pharynx: Oropharynx is clear.   Eyes:     Extraocular Movements: Extraocular movements intact.     Conjunctiva/sclera: Conjunctivae normal.     Pupils: Pupils are equal, round, and reactive to light.    Cardiovascular:     Rate and Rhythm: Normal rate and regular rhythm.     Pulses: Normal pulses.     Heart sounds: Normal heart sounds.  Pulmonary:     Effort: Pulmonary effort is normal. No respiratory distress.     Breath sounds: Normal breath sounds.  Abdominal:     General: There is no distension.     Palpations: Abdomen is soft.     Tenderness: There is no abdominal tenderness.   Musculoskeletal:        General: Normal range of motion.     Cervical back: Normal range of motion.   Skin:    General: Skin is warm and dry.   Neurological:     General: No focal deficit present.     Mental Status: She is alert and oriented to person, place, and time.     Cranial Nerves: No cranial nerve deficit or facial asymmetry.     Sensory: No sensory deficit.     Motor: No weakness.     Coordination: Coordination normal.     Gait: Gait normal.     UC Treatments / Results  Labs (all labs ordered are listed, but only abnormal results are displayed) Labs Reviewed - No data to display  EKG  Radiology No results found.  Procedures Procedures (including critical care time)  Medications Ordered in UC Medications - No data to display  Initial Impression / Assessment and Plan / UC Course  I have reviewed the triage vital signs and the nursing notes.  Pertinent labs & imaging results that were available during my care of the patient were reviewed by me and considered in my medical decision making (see chart for details).  Well appearing, stable vitals. Neurologically intact. Strength and sensation normal, FAST negative, no active chest pain at this  time. EKG normal sinus with ventricular rate 79 BPM. No ST or T wave changes. Compared to prior reading from 07/2022.  Vague symptoms. Discussion with patient no red flag symptoms at this time. She is wondering if it's a heart attack. Discussed I cannot rule this out without troponin lab which is only done in the ED, although I have less concern for this. She is also wondering about the numbness in the left arm which is slowly ascending. Odd that her sensation is equal and intact on exam. However I discussed there is further imaging available in the emergency department if her symptoms are concerning her. Her husband is bedside and would like to take her to the ED. Patient is agreeable as she would like further workup which is not able to be done in the urgent care setting.  Final Clinical Impressions(s) / UC Diagnoses   Final diagnoses:  Chest pain, unspecified type  Dizziness  Left arm numbness   Discharge Instructions   None    ED Prescriptions   None    PDMP not reviewed this encounter.   Jeryl Stabs, PA-C 03/15/24 1538

## 2024-03-15 NOTE — ED Triage Notes (Signed)
 Tight chest woke patient at 2 am.  Had dizziness.  Also noted belching.  Patient reports left hand numbness.  Left hand remains numb.  Numbness moving up left arm.  Tightness is epigastric.  Upper chest is not constant.  Belching seems to change pain in chest.    Patient has taken tums, has had pumpkin seeds    Has taken vitamins, and eaten cereal.    Patient does not have a pcp, but has a new patient appt coming up

## 2024-03-15 NOTE — ED Notes (Signed)
 Patient is being discharged from the Urgent Care and sent to the Emergency Department via POV . Per Asberry Rising, PA-C, patient is in need of higher level of care due to chest pain. Patient is aware and verbalizes understanding of plan of care.  Vitals:   03/15/24 1428  BP: (!) 140/77  Pulse: 93  Resp: 18  SpO2: 100%

## 2024-03-15 NOTE — ED Notes (Signed)
 Patient reports abdominal pain after eating last night.  Has had 3-4 episodes of diarrhea since last night.  Has drank club soda today, coffee.  Patient is talking of frequent urination noted today

## 2024-05-16 DIAGNOSIS — J301 Allergic rhinitis due to pollen: Secondary | ICD-10-CM | POA: Insufficient documentation

## 2024-05-16 NOTE — Progress Notes (Unsigned)
     Viriginia Amendola T. Amarianna Abplanalp, MD, CAQ [facility] at [facility] [address] [place], [address]  Phone: [phone]  FAX: [phone]  Marilyn Cummings - 43 y.o. female  MRN [id number]  Date of Birth: 1981-05-03  Date: 05/17/2024  PCP: Joshua Debby CROME, MD  Referral: Joshua Debby CROME, MD  No chief complaint on file.  Subjective:   Marilyn Cummings is a 43 y.o. very pleasant female patient with There is no height or weight on file to calculate BMI. who presents with the following:  Discussed the use of AI scribe software for clinical note transcription with the patient, who gave verbal consent to proceed.  Patient is here as a new patient primary care patient.  She did distantly see Dr. Joshua in [place], and after this she has been seen in the [facility] system for primary care.  She does have a history of hypertension, and she previously took Benicar , which has now been stopped. History of Present Illness     Review of Systems is noted in the HPI, as appropriate  Objective:   There were no vitals taken for this visit.  GEN: No acute distress; alert,appropriate. PULM: Breathing comfortably in no respiratory distress PSYCH: Normally interactive.    Laboratory and Imaging Data:  Assessment and Plan:   No diagnosis found. Assessment & Plan   Medication Management during today's office visit: No orders of the defined types were placed in this encounter.  There are no discontinued medications.  Orders placed today for conditions managed today: No orders of the defined types were placed in this encounter.   Disposition: No follow-ups on file.  [employer] speech-to-text software was used for transcription in this dictation.  Possible transcriptional errors can occur using Animal nutritionist.   Signed,  Jacques DASEN. Jovann Luse, MD   Outpatient Encounter Medications as of 05/17/2024  Medication Sig    cetirizine (ZYRTEC) 10 MG tablet Take 10 mg by mouth at bedtime.   meloxicam  (MOBIC ) 15 MG tablet Take 1 tablet (15 mg total) by mouth daily.   olmesartan  (BENICAR ) 20 MG tablet Take 1 tablet (20 mg total) by mouth daily.   Vitamin D , Ergocalciferol , (DRISDOL ) 1.25 MG (50000 UNIT) CAPS capsule Take 1 capsule (50,000 Units total) by mouth every 7 (seven) days.   No facility-administered encounter medications on file as of 05/17/2024.

## 2024-05-17 ENCOUNTER — Encounter: Payer: Self-pay | Admitting: Family Medicine

## 2024-05-17 ENCOUNTER — Ambulatory Visit: Payer: Self-pay | Admitting: Family Medicine

## 2024-05-17 VITALS — BP 112/72 | HR 84 | Temp 99.0°F | Ht 62.0 in | Wt 136.1 lb

## 2024-05-17 DIAGNOSIS — K219 Gastro-esophageal reflux disease without esophagitis: Secondary | ICD-10-CM

## 2024-05-17 DIAGNOSIS — J301 Allergic rhinitis due to pollen: Secondary | ICD-10-CM

## 2024-05-17 DIAGNOSIS — I10 Essential (primary) hypertension: Secondary | ICD-10-CM

## 2024-05-17 DIAGNOSIS — Z13220 Encounter for screening for lipoid disorders: Secondary | ICD-10-CM

## 2024-05-17 DIAGNOSIS — Z131 Encounter for screening for diabetes mellitus: Secondary | ICD-10-CM

## 2024-05-17 DIAGNOSIS — E559 Vitamin D deficiency, unspecified: Secondary | ICD-10-CM

## 2024-05-17 DIAGNOSIS — R5383 Other fatigue: Secondary | ICD-10-CM

## 2024-05-20 ENCOUNTER — Encounter: Payer: Self-pay | Admitting: Family Medicine

## 2024-05-20 DIAGNOSIS — Z9189 Other specified personal risk factors, not elsewhere classified: Secondary | ICD-10-CM

## 2024-05-20 DIAGNOSIS — R7989 Other specified abnormal findings of blood chemistry: Secondary | ICD-10-CM

## 2024-05-20 DIAGNOSIS — Z131 Encounter for screening for diabetes mellitus: Secondary | ICD-10-CM

## 2024-05-20 DIAGNOSIS — R5383 Other fatigue: Secondary | ICD-10-CM

## 2024-05-21 ENCOUNTER — Other Ambulatory Visit (INDEPENDENT_AMBULATORY_CARE_PROVIDER_SITE_OTHER)

## 2024-05-21 DIAGNOSIS — R5383 Other fatigue: Secondary | ICD-10-CM

## 2024-05-21 DIAGNOSIS — Z9189 Other specified personal risk factors, not elsewhere classified: Secondary | ICD-10-CM | POA: Diagnosis not present

## 2024-05-21 DIAGNOSIS — R7989 Other specified abnormal findings of blood chemistry: Secondary | ICD-10-CM

## 2024-05-21 DIAGNOSIS — Z131 Encounter for screening for diabetes mellitus: Secondary | ICD-10-CM | POA: Diagnosis not present

## 2024-05-21 DIAGNOSIS — Z13220 Encounter for screening for lipoid disorders: Secondary | ICD-10-CM | POA: Diagnosis not present

## 2024-05-21 DIAGNOSIS — E559 Vitamin D deficiency, unspecified: Secondary | ICD-10-CM

## 2024-05-21 LAB — VITAMIN D 25 HYDROXY (VIT D DEFICIENCY, FRACTURES): VITD: 53.77 ng/mL (ref 30.00–100.00)

## 2024-05-21 LAB — CBC WITH DIFFERENTIAL/PLATELET
Basophils Absolute: 0 K/uL (ref 0.0–0.1)
Basophils Relative: 0.6 % (ref 0.0–3.0)
Eosinophils Absolute: 0.1 K/uL (ref 0.0–0.7)
Eosinophils Relative: 2 % (ref 0.0–5.0)
HCT: 39.4 % (ref 36.0–46.0)
Hemoglobin: 13.2 g/dL (ref 12.0–15.0)
Lymphocytes Relative: 39.8 % (ref 12.0–46.0)
Lymphs Abs: 2.1 K/uL (ref 0.7–4.0)
MCHC: 33.5 g/dL (ref 30.0–36.0)
MCV: 85.3 fl (ref 78.0–100.0)
Monocytes Absolute: 0.4 K/uL (ref 0.1–1.0)
Monocytes Relative: 8 % (ref 3.0–12.0)
Neutro Abs: 2.6 K/uL (ref 1.4–7.7)
Neutrophils Relative %: 49.6 % (ref 43.0–77.0)
Platelets: 305 K/uL (ref 150.0–400.0)
RBC: 4.62 Mil/uL (ref 3.87–5.11)
RDW: 12.8 % (ref 11.5–15.5)
WBC: 5.3 K/uL (ref 4.0–10.5)

## 2024-05-21 LAB — BASIC METABOLIC PANEL WITH GFR
BUN: 14 mg/dL (ref 6–23)
CO2: 26 meq/L (ref 19–32)
Calcium: 9.1 mg/dL (ref 8.4–10.5)
Chloride: 103 meq/L (ref 96–112)
Creatinine, Ser: 0.67 mg/dL (ref 0.40–1.20)
GFR: 107.94 mL/min (ref >60.00)
Glucose, Bld: 80 mg/dL (ref 70–99)
Potassium: 3.8 meq/L (ref 3.5–5.1)
Sodium: 140 meq/L (ref 135–145)

## 2024-05-21 LAB — LIPID PANEL
Cholesterol: 188 mg/dL (ref 0–200)
HDL: 57.8 mg/dL (ref >39.00)
LDL Cholesterol: 118 mg/dL — ABNORMAL HIGH (ref 0–99)
NonHDL: 130.65
Total CHOL/HDL Ratio: 3
Triglycerides: 64 mg/dL (ref 0.0–149.0)
VLDL: 12.8 mg/dL (ref 0.0–40.0)

## 2024-05-21 LAB — IBC + FERRITIN
Ferritin: 58.7 ng/mL (ref 10.0–291.0)
Iron: 58 ug/dL (ref 42–145)
Saturation Ratios: 16.5 % — ABNORMAL LOW (ref 20.0–50.0)
TIBC: 351.4 ug/dL (ref 250.0–450.0)
Transferrin: 251 mg/dL (ref 212.0–360.0)

## 2024-05-21 LAB — HEPATIC FUNCTION PANEL
ALT: 13 U/L (ref 0–35)
AST: 17 U/L (ref 0–37)
Albumin: 4.7 g/dL (ref 3.5–5.2)
Alkaline Phosphatase: 41 U/L (ref 39–117)
Bilirubin, Direct: 0.1 mg/dL (ref 0.0–0.3)
Total Bilirubin: 0.5 mg/dL (ref 0.2–1.2)
Total Protein: 7.4 g/dL (ref 6.0–8.3)

## 2024-05-21 LAB — CORTISOL: Cortisol, Plasma: 10.3 ug/dL

## 2024-05-21 LAB — T4, FREE: Free T4: 1.08 ng/dL (ref 0.60–1.60)

## 2024-05-21 LAB — T3, FREE: T3, Free: 3.5 pg/mL (ref 2.3–4.2)

## 2024-05-21 LAB — HIGH SENSITIVITY CRP: CRP, High Sensitivity: 3.55 mg/L (ref 0.000–5.000)

## 2024-05-21 LAB — HEMOGLOBIN A1C: Hgb A1c MFr Bld: 5.6 % (ref 4.6–6.5)

## 2024-05-21 LAB — TSH: TSH: 1.92 u[IU]/mL (ref 0.35–5.50)

## 2024-05-25 LAB — LIPOPROTEIN A (LPA): Lipoprotein (a): 131 nmol/L — ABNORMAL HIGH (ref <75)

## 2024-05-31 LAB — INSULIN, FREE AND TOTAL
Free Insulin: 3.4 uU/mL
Total Insulin: 3.4 uU/mL

## 2024-06-01 ENCOUNTER — Ambulatory Visit: Payer: Self-pay | Admitting: Family Medicine

## 2025-06-27 ENCOUNTER — Encounter: Admitting: Family Medicine
